# Patient Record
Sex: Female | Born: 1965 | Race: White | Hispanic: No | Smoking: Current every day smoker
Health system: Southern US, Community
[De-identification: ages and names within clinical notes are randomized; demographics above are authoritative.]

## PROBLEM LIST (undated history)

## (undated) VITALS — BP 103/69 | HR 66 | Temp 98.4°F | Resp 16 | Ht 61.0 in | Wt 124.0 lb

## (undated) DIAGNOSIS — M1990 Unspecified osteoarthritis, unspecified site: Secondary | ICD-10-CM

## (undated) DIAGNOSIS — C801 Malignant (primary) neoplasm, unspecified: Secondary | ICD-10-CM

## (undated) DIAGNOSIS — J449 Chronic obstructive pulmonary disease, unspecified: Secondary | ICD-10-CM

## (undated) DIAGNOSIS — F329 Major depressive disorder, single episode, unspecified: Secondary | ICD-10-CM

## (undated) DIAGNOSIS — K219 Gastro-esophageal reflux disease without esophagitis: Secondary | ICD-10-CM

## (undated) DIAGNOSIS — F419 Anxiety disorder, unspecified: Secondary | ICD-10-CM

## (undated) DIAGNOSIS — F32A Depression, unspecified: Secondary | ICD-10-CM

## (undated) DIAGNOSIS — R51 Headache: Secondary | ICD-10-CM

## (undated) DIAGNOSIS — R0602 Shortness of breath: Secondary | ICD-10-CM

## (undated) DIAGNOSIS — F99 Mental disorder, not otherwise specified: Secondary | ICD-10-CM

## (undated) HISTORY — PX: ABDOMINAL HYSTERECTOMY: SHX81

## (undated) HISTORY — PX: OTHER SURGICAL HISTORY: SHX169

---

## 2012-05-23 ENCOUNTER — Encounter (HOSPITAL_COMMUNITY): Payer: Self-pay

## 2012-05-23 ENCOUNTER — Emergency Department (HOSPITAL_COMMUNITY)
Admission: EM | Admit: 2012-05-23 | Discharge: 2012-05-23 | Disposition: A | Discharge status: Home / Self Care | Payer: Self-pay | Attending: Emergency Medicine | Admitting: Emergency Medicine

## 2012-05-23 DIAGNOSIS — F17200 Nicotine dependence, unspecified, uncomplicated: Secondary | ICD-10-CM | POA: Insufficient documentation

## 2012-05-23 DIAGNOSIS — M25529 Pain in unspecified elbow: Secondary | ICD-10-CM | POA: Insufficient documentation

## 2012-05-23 MED ORDER — HYDROCODONE-ACETAMINOPHEN 5-325 MG PO TABS: 1.0000 | ORAL_TABLET | Freq: Three times a day (TID) | ORAL | Status: AC | PRN

## 2012-05-23 MED ORDER — HYDROCODONE-ACETAMINOPHEN 5-325 MG PO TABS
1.0000 | ORAL_TABLET | Freq: Once | ORAL | Status: AC
  Administered 2012-05-23: 1 via ORAL
  Filled 2012-05-23: qty 1

## 2012-05-23 MED ORDER — IBUPROFEN 400 MG PO TABS
800.0000 mg | ORAL_TABLET | Freq: Once | ORAL | Status: AC
  Administered 2012-05-23: 800 mg via ORAL
  Filled 2012-05-23: qty 2

## 2012-05-23 MED ORDER — IBUPROFEN 800 MG PO TABS: 800.0000 mg | ORAL_TABLET | Freq: Three times a day (TID) | ORAL | Status: AC

## 2012-05-23 NOTE — Discharge Instructions (Signed)
It is very important that you rest the elbow as much as possible, and follow-up with your orthopedist.

## 2012-05-23 NOTE — ED Notes (Signed)
Hx of surgery to arm and elbow to right arm. sts pain now in elbow, tendon to elbow is painful and is popping.

## 2012-05-23 NOTE — ED Provider Notes (Signed)
History   This chart was scribed for Gerhard Munch, MD by Shari Heritage. The patient was seen in room TR11C/TR11C. Patient's care was started at 1547.     CSN: [id number]  Arrival date & time 05/23/12  1547   First MD Initiated Contact with Patient 05/23/12 1810      Chief Complaint  Patient presents with  . Joint Swelling    (Consider location/radiation/quality/duration/timing/severity/associated sxs/prior treatment) The history is provided by the patient. No language interpreter was used.   Martha Jordan is a 46 y.o. female who presents to the Emergency Department complaining of dull, constant pain originating in the right elbow and traveling down the forearm to the hand. Patient says she had heard popping in her right elbow. Patient has been taking 800 mg of Ibuprofen regularly, with minimal relief. Patient says she has experienced persistent pain after a work related accident.  Patient has a h/o of arm surgery.  Orthopaedic Surgeon - [facility])  History reviewed. No pertinent past medical history.  History reviewed. No pertinent past surgical history.  History reviewed. No pertinent family history.  History  Substance Use Topics  . Smoking status: Current Everyday Smoker  . Smokeless tobacco: Not on file  . Alcohol Use: No    OB History    Grav Para Term Preterm Abortions TAB SAB Ect Mult Living                  Review of Systems  Constitutional:       Per HPI, otherwise negative  HENT:       Per HPI, otherwise negative  Eyes: Negative.   Respiratory:       Per HPI, otherwise negative  Cardiovascular:       Per HPI, otherwise negative  Gastrointestinal: Negative for vomiting.  Genitourinary: Negative.   Musculoskeletal:       Per HPI, otherwise negative  Skin: Negative.   Neurological: Negative for syncope.    Allergies  Review of patient's allergies indicates not on file.  Home Medications   Current Outpatient Rx  Name Route Sig  Dispense Refill  . ALBUTEROL SULFATE HFA 108 (90 BASE) MCG/ACT IN AERS Inhalation Inhale 2 puffs into the lungs every 6 (six) hours as needed. For shortess of breath    . DULOXETINE HCL 60 MG PO CPEP Oral Take 60 mg by mouth daily.    Marland Kitchen FLUTICASONE PROPIONATE 50 MCG/ACT NA SUSP Nasal Place 2 sprays into the nose daily.    . IBUPROFEN 200 MG PO TABS Oral Take 800 mg by mouth every 6 (six) hours as needed. For pain    . LORAZEPAM 1 MG PO TABS Oral Take 1 mg by mouth 3 (three) times daily.      BP 140/77  Pulse 84  Temp 98.4 F (36.9 C) (Oral)  Resp 16  SpO2 100%  Physical Exam  Nursing note and vitals reviewed. Constitutional: She is oriented to person, place, and time. She appears well-developed and well-nourished. No distress.  HENT:  Head: Normocephalic and atraumatic.  Eyes: Conjunctivae and EOM are normal.  Cardiovascular: Normal rate and regular rhythm.   Pulmonary/Chest: Effort normal and breath sounds normal. No stridor. No respiratory distress.  Abdominal: She exhibits no distension.  Musculoskeletal: She exhibits no edema.       RUE - Normal grip strength. Normal ROM in wrist. Patient can extend arm 160 degrees. Patient can flex arm 100 degrees. Patient can pronate and supinate.  Neurological: She is  alert and oriented to person, place, and time. No cranial nerve deficit.  Skin: Skin is warm and dry.  Psychiatric: She has a normal mood and affect.    ED Course  Procedures (including critical care time) DIAGNOSTIC STUDIES:   COORDINATION OF CARE: 7:12PM- Patient informed of current plan for treatment and evaluation and agrees with plan at this time. Will prescribe anti-inflammatory medicine and provide ice packs. Recommended that patient rest her right arm and follow up with an orthopedist if there is persistent pain.    Labs Reviewed - No data to display No results found.   No diagnosis found.    MDM  I personally performed the services described in this  documentation, which was scribed in my presence. The recorded information has been reviewed and considered.  This female presents with concerns over right elbow pain and an unusual sensation with flexion.  On exam the patient is in no distress.  She does have a limited range of motion, though she notes that this is consistent since her surgery.  There is no appreciable effusion, and all tendons and muscles are functioning appropriately.  Absent any superficial changes concerning for ongoing infection, and with unremarkable vital signs , this episode pain likely represents an overuse injury with some concern for tendinitis.  There may be transient nerve entrapment or bone spurs causing tenderness discontinuity, though these are not obvious on exam.  I discussed all findings, fossa patient.  We agreed that the x-ray, was unlikely to be of diagnostic benefit.  In the patient was discharged in stable condition with analgesics, instructions to follow up with her orthopedist as soon as possible.   Gerhard Munch, MD 05/23/12 Serena Croissant

## 2012-07-26 ENCOUNTER — Encounter (HOSPITAL_COMMUNITY): Payer: Self-pay | Admitting: *Deleted

## 2012-07-26 ENCOUNTER — Inpatient Hospital Stay (HOSPITAL_COMMUNITY)
Admission: RE | Admit: 2012-07-26 | Discharge: 2012-08-01 | DRG: 885 | Disposition: A | Discharge status: Home / Self Care | Payer: Federal, State, Local not specified - Other | Attending: Psychiatry | Admitting: Psychiatry

## 2012-07-26 DIAGNOSIS — F17200 Nicotine dependence, unspecified, uncomplicated: Secondary | ICD-10-CM | POA: Diagnosis present

## 2012-07-26 DIAGNOSIS — F339 Major depressive disorder, recurrent, unspecified: Secondary | ICD-10-CM | POA: Diagnosis present

## 2012-07-26 DIAGNOSIS — M129 Arthropathy, unspecified: Secondary | ICD-10-CM | POA: Diagnosis present

## 2012-07-26 DIAGNOSIS — F332 Major depressive disorder, recurrent severe without psychotic features: Principal | ICD-10-CM | POA: Diagnosis present

## 2012-07-26 DIAGNOSIS — J4489 Other specified chronic obstructive pulmonary disease: Secondary | ICD-10-CM | POA: Diagnosis present

## 2012-07-26 DIAGNOSIS — K219 Gastro-esophageal reflux disease without esophagitis: Secondary | ICD-10-CM | POA: Diagnosis present

## 2012-07-26 DIAGNOSIS — J449 Chronic obstructive pulmonary disease, unspecified: Secondary | ICD-10-CM | POA: Diagnosis present

## 2012-07-26 DIAGNOSIS — Z859 Personal history of malignant neoplasm, unspecified: Secondary | ICD-10-CM

## 2012-07-26 DIAGNOSIS — Z79899 Other long term (current) drug therapy: Secondary | ICD-10-CM

## 2012-07-26 HISTORY — DX: Major depressive disorder, single episode, unspecified: F32.9

## 2012-07-26 HISTORY — DX: Headache: R51

## 2012-07-26 HISTORY — DX: Shortness of breath: R06.02

## 2012-07-26 HISTORY — DX: Anxiety disorder, unspecified: F41.9

## 2012-07-26 HISTORY — DX: Malignant (primary) neoplasm, unspecified: C80.1

## 2012-07-26 HISTORY — DX: Chronic obstructive pulmonary disease, unspecified: J44.9

## 2012-07-26 HISTORY — DX: Depression, unspecified: F32.A

## 2012-07-26 HISTORY — DX: Mental disorder, not otherwise specified: F99

## 2012-07-26 HISTORY — DX: Unspecified osteoarthritis, unspecified site: M19.90

## 2012-07-26 HISTORY — DX: Gastro-esophageal reflux disease without esophagitis: K21.9

## 2012-07-26 LAB — CBC
HCT: 40.4 % (ref 36.0–46.0)
Hemoglobin: 14 g/dL (ref 12.0–15.0)
MCHC: 34.7 g/dL (ref 30.0–36.0)
RBC: 4.46 MIL/uL (ref 3.87–5.11)
WBC: 6.9 10*3/uL (ref 4.0–10.5)

## 2012-07-26 LAB — COMPREHENSIVE METABOLIC PANEL
ALT: 10 U/L (ref 0–35)
Alkaline Phosphatase: 96 U/L (ref 39–117)
BUN: 3 mg/dL — ABNORMAL LOW (ref 6–23)
CO2: 27 mEq/L (ref 19–32)
Chloride: 99 mEq/L (ref 96–112)
GFR calc Af Amer: >90 mL/min (ref >90)
Glucose, Bld: 82 mg/dL (ref 70–99)
Potassium: 3.5 mEq/L (ref 3.5–5.1)
Sodium: 136 mEq/L (ref 135–145)
Total Bilirubin: 0.2 mg/dL — ABNORMAL LOW (ref 0.3–1.2)
Total Protein: 7.4 g/dL (ref 6.0–8.3)

## 2012-07-26 MED ORDER — HYDROXYZINE HCL 50 MG PO TABS
50.0000 mg | ORAL_TABLET | Freq: Every evening | ORAL | Status: DC | PRN
  Administered 2012-07-26 – 2012-07-27 (×3): 50 mg via ORAL
  Filled 2012-07-26: qty 28

## 2012-07-26 MED ORDER — IBUPROFEN 800 MG PO TABS
800.0000 mg | ORAL_TABLET | Freq: Four times a day (QID) | ORAL | Status: DC | PRN
  Administered 2012-07-27 – 2012-07-29 (×4): 800 mg via ORAL
  Filled 2012-07-26 (×4): qty 1

## 2012-07-26 MED ORDER — DULOXETINE HCL 60 MG PO CPEP
60.0000 mg | ORAL_CAPSULE | Freq: Every day | ORAL | Status: DC
  Administered 2012-07-27 – 2012-08-01 (×6): 60 mg via ORAL
  Filled 2012-07-26 (×7): qty 1
  Filled 2012-07-26: qty 14
  Filled 2012-07-26: qty 1

## 2012-07-26 MED ORDER — ACETAMINOPHEN 325 MG PO TABS
650.0000 mg | ORAL_TABLET | Freq: Four times a day (QID) | ORAL | Status: DC | PRN
  Administered 2012-07-26 – 2012-07-30 (×3): 650 mg via ORAL

## 2012-07-26 MED ORDER — FLUTICASONE PROPIONATE 50 MCG/ACT NA SUSP
2.0000 | Freq: Every day | NASAL | Status: DC
  Administered 2012-07-27 – 2012-07-31 (×5): 2 via NASAL
  Filled 2012-07-26: qty 16

## 2012-07-26 MED ORDER — MAGNESIUM HYDROXIDE 400 MG/5ML PO SUSP
30.0000 mL | Freq: Every day | ORAL | Status: DC | PRN
  Administered 2012-07-31: 30 mL via ORAL

## 2012-07-26 MED ORDER — ALUM & MAG HYDROXIDE-SIMETH 200-200-20 MG/5ML PO SUSP: 30.0000 mL | ORAL | Status: DC | PRN

## 2012-07-26 MED ORDER — NICOTINE 21 MG/24HR TD PT24
21.0000 mg | MEDICATED_PATCH | Freq: Every day | TRANSDERMAL | Status: DC
  Administered 2012-07-26 – 2012-08-01 (×6): 21 mg via TRANSDERMAL
  Filled 2012-07-26: qty 1
  Filled 2012-07-26: qty 14
  Filled 2012-07-26 (×7): qty 1

## 2012-07-26 MED ORDER — ALBUTEROL SULFATE HFA 108 (90 BASE) MCG/ACT IN AERS: 2.0000 | INHALATION_SPRAY | Freq: Four times a day (QID) | RESPIRATORY_TRACT | Status: DC | PRN

## 2012-07-26 NOTE — Progress Notes (Signed)
D: Pt. Scored 32  On total score on BECK DEPRESSION INVENTORY.Results in shadow chart.

## 2012-07-26 NOTE — Tx Team (Signed)
Initial Interdisciplinary Treatment Plan  PATIENT STRENGTHS: (choose at least two) Ability for insight Active sense of humor Average or above average intelligence Capable of independent living Communication skills General fund of knowledge Motivation for treatment/growth Supportive family/friends  PATIENT STRESSORS: Health problems Legal issue Marital or family conflict Medication change or noncompliance Occupational concerns Substance abuse   PROBLEM LIST: Problem List/Patient Goals Date to be addressed Date deferred Reason deferred Estimated date of resolution  Depression       anxiety                                                 DISCHARGE CRITERIA:  Ability to meet basic life and health needs Adequate post-discharge living arrangements Improved stabilization in mood, thinking, and/or behavior Reduction of life-threatening or endangering symptoms to within safe limits Verbal commitment to aftercare and medication compliance  PRELIMINARY DISCHARGE PLAN: Participate in family therapy Return to previous living arrangement  PATIENT/FAMIILY INVOLVEMENT: This treatment plan has been presented to and reviewed with the patient, [name], and/or family member, The patient and family have been given the opportunity to ask questions and make suggestions.  [name] 07/26/2012, 7:24 PM

## 2012-07-26 NOTE — Progress Notes (Signed)
Patient attended the evening Karaoke group.  Pt was alert, engaged, and supportive during group.
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
## 2012-07-26 NOTE — Progress Notes (Signed)
D: 46 yo voluntary  Walk-in presenting with self-harm thoughts but no plan. Pt denies SI,HI, & AVH @ this time & contracts for safety on the unit.Pt. Has not worked in 2 years & workmans comp. has not come through. Pt. Has a steel rod in her RUE.Pt denies ETOH abuse but drinks 2-4 beers weekly. Pt has GERD ;smokes 1 PPD;Hx. Cervical cancer;Pt. Also has arthritis;Tatoo on lt. Back & rt. Hip.Pt is allergic to sulfa drugs;VSS.Pt. Lives with her mother.Pt. Has 3 children but only speaks to one.A: pt was searched --no contraband found;oriented to room & unit;meal provided;pt on 15 minute checks.R: pt. Was tearful but has calmed down.

## 2012-07-27 DIAGNOSIS — F339 Major depressive disorder, recurrent, unspecified: Secondary | ICD-10-CM | POA: Diagnosis present

## 2012-07-27 LAB — URINALYSIS, MICROSCOPIC ONLY
Hgb urine dipstick: NEGATIVE
Leukocytes, UA: NEGATIVE
Specific Gravity, Urine: 1.013 (ref 1.005–1.030)
Urobilinogen, UA: 1 mg/dL (ref 0.0–1.0)

## 2012-07-27 LAB — PREGNANCY, URINE: Preg Test, Ur: NEGATIVE

## 2012-07-27 MED ORDER — VENLAFAXINE HCL ER 75 MG PO CP24
75.0000 mg | ORAL_CAPSULE | Freq: Every day | ORAL | Status: AC
  Administered 2012-07-28 – 2012-07-29 (×2): 75 mg via ORAL
  Filled 2012-07-27 (×2): qty 1

## 2012-07-27 MED ORDER — VENLAFAXINE HCL ER 150 MG PO CP24
150.0000 mg | ORAL_CAPSULE | Freq: Every day | ORAL | Status: DC
  Administered 2012-07-30 – 2012-08-01 (×3): 150 mg via ORAL
  Filled 2012-07-27: qty 14
  Filled 2012-07-27 (×4): qty 1

## 2012-07-27 NOTE — Progress Notes (Signed)
D: Pt in bed resting. Pt was given a repeat dose of vistaril by previous nurse at 2251. A: Writer followed up and noted that pt was resting with eyes closed but was easily awaken as Clinical research associate approached pt to check for respirations. As of 1215 pt was resting with eyes closed. q80min checks remains for this pt.  R: Pt remains safe at q15min checks.
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
## 2012-07-27 NOTE — Progress Notes (Signed)
Martha Jordan is slowly coming out of her shell( and room...so to speak ) . SHe is seen out in the milieu...interacting  With the other pts tolerated well. SHe makes very brief eye contact. She looks down, mostly, and speaks very very quietly.              A She attends her groups as planned and completed her AM self inventory and  on it she wrote she denied SI,she rated her feelings of depression and hopelessness " 9 / 9" and stated her DC [plan was " I don't know".                R Safety is in place, POC moves forward with therpaeutic relationhip establsiehd and nurse to discuss with pt and help pt identify DC palnning gaols. PD RN [facility]
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
## 2012-07-27 NOTE — Tx Team (Signed)
Interdisciplinary Treatment Plan Update (Adult)  Date:  07/27/2012  Time Reviewed:  10:53 AM   Progress in Treatment: Attending groups: Yes Participating in groups:  Yes Taking medication as prescribed: Yes Tolerating medication:  Yes Family/Significant other contact made:  Counselor assessing for appropriate contact Patient understands diagnosis:  Yes Discussing patient identified problems/goals with staff:  Yes Medical problems stabilized or resolved:  Yes Denies suicidal/homicidal ideation: Yes Issues/concerns per patient self-inventory:  None identified Other: N/A  New problem(s) identified: None Identified  Reason for Continuation of Hospitalization: Anxiety Depression Medication stabilization Suicidal ideation  Interventions implemented related to continuation of hospitalization: mood stabilization, medication monitoring and adjustment, group therapy and psycho education, safety checks q 15 mins  Additional comments: N/A  Estimated length of stay: 3-5 days  Discharge Plan: SW will assess for appropriate referrals.    New goal(s): N/A  Review of initial/current patient goals per problem list:    1.  Goal(s): Reduce depressive symptoms  Met:  No  Target date: by discharge  As evidenced by: Reducing depression from a 10 to a 3 as reported by pt.   2.  Goal (s): Reduce/Eliminate suicidal ideation  Met:  No  Target date: by discharge  As evidenced by: pt reporting no SI.    3.  Goal(s): Reduce anxiety symptoms  Met:  No  Target date: by discharge  As evidenced by: Reduce anxiety from a 10 to a 3 as reported by pt.    Attendees: Patient:  Martha Jordan 07/27/2012 10:53 AM   Family:     Physician:  Orson Aloe, MD  07/27/2012  10:53 AM   Nursing:   Lamount Cranker ,RN 07/27/2012 10:53 AM   Case Manager:  Reyes Ivan, LCSWA 07/27/2012  10:53 AM   Counselor:   07/27/2012  10:53 AM   Other:  Juline Patch, LCSW 07/27/2012  10:53 AM   Other:  Trula Slade,  MSW intern 07/27/2012 10:53 AM   Other:  Foye Clock, MSW intern 07/27/2012 10:54 AM   Other:      Scribe for Treatment Team:   Carmina Miller, 07/27/2012 , 10:53 AM
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
## 2012-07-27 NOTE — [facility] Suicide Risk Assessment (Signed)
Suicide Risk Assessment  Admission Assessment     Demographic factors:  Assessment Details Time of Assessment: Admission Information Obtained From: Patient Current Mental Status:  Current Mental Status: Suicidal ideation indicated by patient;Self-harm thoughts Loss Factors:  Loss Factors: Loss of significant relationship;Legal issues;Financial problems / change in socioeconomic status Historical Factors:  Historical Factors: Family history of suicide Risk Reduction Factors:  Risk Reduction Factors: Sense of responsibility to family;Religious beliefs about death;Living with another person, especially a relative  CLINICAL FACTORS:   Severe Anxiety and/or Agitation Depression:   Anhedonia Previous Psychiatric Diagnoses and Treatments  COGNITIVE FEATURES THAT CONTRIBUTE TO RISK:  Thought constriction (tunnel vision)    SUICIDE RISK:   Moderate:  Frequent suicidal ideation with limited intensity, and duration, some specificity in terms of plans, no associated intent, good self-control, limited dysphoria/symptomatology, some risk factors present, and identifiable protective factors, including available and accessible social support.  Reason for hospitalization: .Feeling hopeless and depressed Diagnosis:   Axis I: Major Depression, Recurrent severe and Compulsive behavior disorder Axis II: Deferred Axis III:  Past Medical History  Diagnosis Date  . Anxiety   . Mental disorder   . Depression   . COPD (chronic obstructive pulmonary disease)   . Shortness of breath   . GERD (gastroesophageal reflux disease)   . Headache   . Cancer   . Arthritis    Axis IV: other psychosocial or environmental problems Axis V: 31-40 impairment in reality testing  ADL's:  Intact  Sleep: Fair  Appetite:  Fair  Suicidal Ideation:  Pt quite depressed and feeling hopeless Homicidal Ideation:  Pt denies any thoughts, plans, intent of homicide  AEB (as evidenced by): per pt report  Mental Status  Examination/Evaluation: Objective:  Appearance: Casual  Eye Contact::  Good  Speech:  Clear and Coherent  Volume:  Normal  Mood:  Anxious, Depressed, Hopeless, Irritable and Worthless  Affect:  Congruent  Thought Process:  Coherent  Orientation:  Full  Thought Content:  WDL  Suicidal Thoughts:  No  Homicidal Thoughts:  No  Memory:  Immediate;   Fair Recent;   Fair Remote;   Fair  Judgement:  Impaired  Insight:  Lacking  Psychomotor Activity:  Normal  Concentration:  Fair  Recall:  Fair  Akathisia:  No  Handed:  Right  AIMS (if indicated):     Assets:  Communication Skills Desire for Improvement  Sleep:  Number of Hours: 3.75    Vital Signs:Blood pressure 120/74, pulse 76, temperature 98.4 F (36.9 C), temperature source Oral, resp. rate 16, height 5\' 1"  (1.549 m), weight 56.246 kg (124 lb). Current Medications: Current Facility-Administered Medications  Medication Dose Route Frequency Provider Last Rate Last Dose  . acetaminophen (TYLENOL) tablet 650 mg  650 mg Oral Q6H PRN Mickie D. Adams, PA   650 mg at 07/26/12 2001  . albuterol (PROVENTIL HFA;VENTOLIN HFA) 108 (90 BASE) MCG/ACT inhaler 2 puff  2 puff Inhalation Q6H PRN Mickie D. Adams, PA      . alum & mag hydroxide-simeth (MAALOX/MYLANTA) 200-200-20 MG/5ML suspension 30 mL  30 mL Oral Q4H PRN Mickie D. Pernell Dupre, PA      . DULoxetine (CYMBALTA) DR capsule 60 mg  60 mg Oral Daily Mickie D. Adams, PA   60 mg at 07/27/12 1002  . fluticasone (FLONASE) 50 MCG/ACT nasal spray 2 spray  2 spray Each Nare Daily Mickie D. Adams, PA   2 spray at 07/27/12 1427  . hydrOXYzine (ATARAX/VISTARIL) tablet 50 mg  50  mg Oral QHS PRN,MR X 1 Mickie D. Adams, PA   50 mg at 07/26/12 2251  . ibuprofen (ADVIL,MOTRIN) tablet 800 mg  800 mg Oral Q6H PRN Mickie D. Adams, PA   800 mg at 07/27/12 1442  . magnesium hydroxide (MILK OF MAGNESIA) suspension 30 mL  30 mL Oral Daily PRN Mickie D. Adams, PA      . nicotine (NICODERM CQ - dosed in mg/24 hours)  patch 21 mg  21 mg Transdermal Q0600 Mickie D. Adams, PA   21 mg at 07/26/12 2004  . venlafaxine XR (EFFEXOR-XR) 24 hr capsule 75 mg  75 mg Oral Q breakfast Mike Craze, MD       Followed by  . venlafaxine XR (EFFEXOR-XR) 24 hr capsule 150 mg  150 mg Oral Q breakfast Mike Craze, MD        Lab Results:  Results for orders placed during the hospital encounter of 07/26/12 (from the past 48 hour(s))  CBC     Status: Normal   Collection Time   07/26/12  7:31 PM      Component Value Range Comment   WBC 6.9  4.0 - 10.5 K/uL    RBC 4.46  3.87 - 5.11 MIL/uL    Hemoglobin 14.0  12.0 - 15.0 g/dL    HCT 81.1  91.4 - 78.2 %    MCV 90.6  78.0 - 100.0 fL    MCH 31.4  26.0 - 34.0 pg    MCHC 34.7  30.0 - 36.0 g/dL    RDW 95.6  21.3 - 08.6 %    Platelets 330  150 - 400 K/uL   COMPREHENSIVE METABOLIC PANEL     Status: Abnormal   Collection Time   07/26/12  7:31 PM      Component Value Range Comment   Sodium 136  135 - 145 mEq/L    Potassium 3.5  3.5 - 5.1 mEq/L    Chloride 99  96 - 112 mEq/L    CO2 27  19 - 32 mEq/L    Glucose, Bld 82  70 - 99 mg/dL    BUN 3 (*) 6 - 23 mg/dL    Creatinine, Ser 5.78  0.50 - 1.10 mg/dL    Calcium 9.5  8.4 - 46.9 mg/dL    Total Protein 7.4  6.0 - 8.3 g/dL    Albumin 3.7  3.5 - 5.2 g/dL    AST 16  0 - 37 U/L    ALT 10  0 - 35 U/L    Alkaline Phosphatase 96  39 - 117 U/L    Total Bilirubin 0.2 (*) 0.3 - 1.2 mg/dL    GFR calc non Af Amer >90  >90 mL/min    GFR calc Af Amer >90  >90 mL/min   TSH     Status: Normal   Collection Time   07/26/12  7:31 PM      Component Value Range Comment   TSH 0.633  0.350 - 4.500 uIU/mL   URINALYSIS, WITH MICROSCOPIC     Status: Normal   Collection Time   07/26/12 10:06 PM      Component Value Range Comment   Color, Urine YELLOW  YELLOW    APPearance CLEAR  CLEAR    Specific Gravity, Urine 1.013  1.005 - 1.030    pH 6.0  5.0 - 8.0    Glucose, UA NEGATIVE  NEGATIVE mg/dL    Hgb urine dipstick NEGATIVE  NEGATIVE  Bilirubin Urine NEGATIVE  NEGATIVE    Ketones, ur NEGATIVE  NEGATIVE mg/dL    Protein, ur NEGATIVE  NEGATIVE mg/dL    Urobilinogen, UA 1.0  0.0 - 1.0 mg/dL    Nitrite NEGATIVE  NEGATIVE    Leukocytes, UA NEGATIVE  NEGATIVE    WBC, UA 0-2  <3 WBC/hpf    RBC / HPF 0-2  <3 RBC/hpf    Bacteria, UA RARE  RARE    Squamous Epithelial / LPF RARE  RARE   PREGNANCY, URINE     Status: Normal   Collection Time   07/26/12 10:06 PM      Component Value Range Comment   Preg Test, Ur NEGATIVE  NEGATIVE     Physical Findings: AIMS: Facial and Oral Movements Muscles of Facial Expression: None, normal Lips and Perioral Area: None, normal Jaw: None, normal Tongue: None, normal,Extremity Movements Upper (arms, wrists, hands, fingers): None, normal Lower (legs, knees, ankles, toes): None, normal, Trunk Movements Neck, shoulders, hips: None, normal, Overall Severity Severity of abnormal movements (highest score from questions above): None, normal Incapacitation due to abnormal movements: None, normal Patient's awareness of abnormal movements (rate only patient's report): No Awareness, Dental Status Current problems with teeth and/or dentures?: No Does patient usually wear dentures?: No  CIWA:  CIWA-Ar Total: 0  COWS:     Risk: Risk of harm to self is elevated by her depression and her addiction to fixing other people  Risk of harm to others is minimal in that she has not been involved in fights or had any legal charges filed on her.  Treatment Plan Summary: Daily contact with patient to assess and evaluate symptoms and progress in treatment Medication management Mood/anxiety less than 3/10 where the scale is 1 is the best and 10 is the worst No suicidal or homicidal thoughts for at least 48 hours.  Plan: Admit, restart Effexor, encourage pt to attend 12 Step meetings to help her find a different way of living life.  Discussed the risks, benefits, and probable clinical course with and without  treatment.  Pt is agreeable to the current course of treatment. We will continue on q. 15 checks the unit protocol. At this time there is no clinical indication for one-to-one observation as patient contract for safety and presents little risk to harm themself and others.  We will increase collateral information. I encourage patient to participate in group milieu therapy. Pt will be seen in treatment team soon for further treatment and appropriate discharge planning. Please see history and physical note for more detailed information ELOS: 3 to 5 days.   Tnia Anglada 07/27/2012, 7:32 PM

## 2012-07-27 NOTE — Progress Notes (Signed)
Psychoeducational Group Note  Date:  07/27/2012 Time:  1100  Group Topic/Focus:  Recovery Goals:   The focus of this group is to identify appropriate goals for recovery and establish a plan to achieve them.  Participation Level:  Active  Participation Quality:  Appropriate and Attentive  Affect:  Appropriate  Cognitive:  Appropriate  Insight:  Good  Engagement in Group:  Good  Additional Comments:  Pt participated in group. Pt explained defined in own words of relapse and was given term of relapse.Pt also was given information on recognizing when in crisis of relapsing. Pt stated ways, times, behaviors, and attitudes when in stages of relapsing. Pt also was given a handout on defining relapse, and coping skills to prevent relapsing. Pt was appropriate and shared during group.   Karleen Hampshire Brittini 07/27/2012, 3:16 PM

## 2012-07-27 NOTE — Progress Notes (Signed)
[facility] Group Notes:  (Counselor/Nursing/MHT/Case Management/Adjunct)  07/27/2012 10:23 PM  Type of Therapy:  Psychoeducational Skills  Participation Level:  Minimal  Participation Quality:  Attentive  Affect:  Depressed  Cognitive:  Appropriate  Insight:  Good  Engagement in Group:  Limited  Engagement in Therapy:  Limited  Modes of Intervention:  Education  Summary of Progress/Problems:Pt. States that she had a better day as compared to yesterday. She attributes her good day to socializing more with her peers and because she feels that her medications are working better for her. Her goal for tomorrow is to continue to work on talking to her peers.     Martha Jordan S 07/27/2012, 10:23 PM

## 2012-07-27 NOTE — Progress Notes (Signed)
[facility] MD Progress Note  07/27/2012 7:24 PM  Diagnosis:   Axis I: Major Depression, Recurrent severe and Compulsive behavior disorder Axis II: Deferred Axis III:  Past Medical History  Diagnosis Date  . Anxiety   . Mental disorder   . Depression   . COPD (chronic obstructive pulmonary disease)   . Shortness of breath   . GERD (gastroesophageal reflux disease)   . Headache   . Cancer   . Arthritis    Axis IV: other psychosocial or environmental problems Axis V: 31-40 impairment in reality testing  ADL's:  Intact  Sleep: Fair  Appetite:  Fair  Suicidal Ideation:  Pt quite depressed and feeling hopeless Homicidal Ideation:  Pt denies any thoughts, plans, intent of homicide  AEB (as evidenced by): per pt report  Mental Status Examination/Evaluation: Objective:  Appearance: Casual  Eye Contact::  Good  Speech:  Clear and Coherent  Volume:  Normal  Mood:  Anxious, Depressed, Hopeless, Irritable and Worthless  Affect:  Congruent  Thought Process:  Coherent  Orientation:  Full  Thought Content:  WDL  Suicidal Thoughts:  No  Homicidal Thoughts:  No  Memory:  Immediate;   Fair Recent;   Fair Remote;   Fair  Judgement:  Impaired  Insight:  Lacking  Psychomotor Activity:  Normal  Concentration:  Fair  Recall:  Fair  Akathisia:  No  Handed:  Right  AIMS (if indicated):     Assets:  Communication Skills Desire for Improvement  Sleep:  Number of Hours: 3.75    Vital Signs:Blood pressure 120/74, pulse 76, temperature 98.4 F (36.9 C), temperature source Oral, resp. rate 16, height 5\' 1"  (1.549 m), weight 56.246 kg (124 lb). Current Medications: Current Facility-Administered Medications  Medication Dose Route Frequency Provider Last Rate Last Dose  . acetaminophen (TYLENOL) tablet 650 mg  650 mg Oral Q6H PRN Mickie D. Adams, PA   650 mg at 07/26/12 2001  . albuterol (PROVENTIL HFA;VENTOLIN HFA) 108 (90 BASE) MCG/ACT inhaler 2 puff  2 puff Inhalation Q6H PRN Mickie D.  Adams, PA      . alum & mag hydroxide-simeth (MAALOX/MYLANTA) 200-200-20 MG/5ML suspension 30 mL  30 mL Oral Q4H PRN Mickie D. Pernell Dupre, PA      . DULoxetine (CYMBALTA) DR capsule 60 mg  60 mg Oral Daily Mickie D. Adams, PA   60 mg at 07/27/12 1002  . fluticasone (FLONASE) 50 MCG/ACT nasal spray 2 spray  2 spray Each Nare Daily Mickie D. Adams, PA   2 spray at 07/27/12 1427  . hydrOXYzine (ATARAX/VISTARIL) tablet 50 mg  50 mg Oral QHS PRN,MR X 1 Mickie D. Adams, PA   50 mg at 07/26/12 2251  . ibuprofen (ADVIL,MOTRIN) tablet 800 mg  800 mg Oral Q6H PRN Mickie D. Adams, PA   800 mg at 07/27/12 1442  . magnesium hydroxide (MILK OF MAGNESIA) suspension 30 mL  30 mL Oral Daily PRN Mickie D. Adams, PA      . nicotine (NICODERM CQ - dosed in mg/24 hours) patch 21 mg  21 mg Transdermal Q0600 Mickie D. Adams, PA   21 mg at 07/26/12 2004  . venlafaxine XR (EFFEXOR-XR) 24 hr capsule 75 mg  75 mg Oral Q breakfast Mike Craze, MD       Followed by  . venlafaxine XR (EFFEXOR-XR) 24 hr capsule 150 mg  150 mg Oral Q breakfast Mike Craze, MD        Lab Results:  Results for orders  placed during the hospital encounter of 07/26/12 (from the past 48 hour(s))  CBC     Status: Normal   Collection Time   07/26/12  7:31 PM      Component Value Range Comment   WBC 6.9  4.0 - 10.5 K/uL    RBC 4.46  3.87 - 5.11 MIL/uL    Hemoglobin 14.0  12.0 - 15.0 g/dL    HCT 96.0  45.4 - 09.8 %    MCV 90.6  78.0 - 100.0 fL    MCH 31.4  26.0 - 34.0 pg    MCHC 34.7  30.0 - 36.0 g/dL    RDW 11.9  14.7 - 82.9 %    Platelets 330  150 - 400 K/uL   COMPREHENSIVE METABOLIC PANEL     Status: Abnormal   Collection Time   07/26/12  7:31 PM      Component Value Range Comment   Sodium 136  135 - 145 mEq/L    Potassium 3.5  3.5 - 5.1 mEq/L    Chloride 99  96 - 112 mEq/L    CO2 27  19 - 32 mEq/L    Glucose, Bld 82  70 - 99 mg/dL    BUN 3 (*) 6 - 23 mg/dL    Creatinine, Ser 5.62  0.50 - 1.10 mg/dL    Calcium 9.5  8.4 - 13.0 mg/dL      Total Protein 7.4  6.0 - 8.3 g/dL    Albumin 3.7  3.5 - 5.2 g/dL    AST 16  0 - 37 U/L    ALT 10  0 - 35 U/L    Alkaline Phosphatase 96  39 - 117 U/L    Total Bilirubin 0.2 (*) 0.3 - 1.2 mg/dL    GFR calc non Af Amer >90  >90 mL/min    GFR calc Af Amer >90  >90 mL/min   TSH     Status: Normal   Collection Time   07/26/12  7:31 PM      Component Value Range Comment   TSH 0.633  0.350 - 4.500 uIU/mL   URINALYSIS, WITH MICROSCOPIC     Status: Normal   Collection Time   07/26/12 10:06 PM      Component Value Range Comment   Color, Urine YELLOW  YELLOW    APPearance CLEAR  CLEAR    Specific Gravity, Urine 1.013  1.005 - 1.030    pH 6.0  5.0 - 8.0    Glucose, UA NEGATIVE  NEGATIVE mg/dL    Hgb urine dipstick NEGATIVE  NEGATIVE    Bilirubin Urine NEGATIVE  NEGATIVE    Ketones, ur NEGATIVE  NEGATIVE mg/dL    Protein, ur NEGATIVE  NEGATIVE mg/dL    Urobilinogen, UA 1.0  0.0 - 1.0 mg/dL    Nitrite NEGATIVE  NEGATIVE    Leukocytes, UA NEGATIVE  NEGATIVE    WBC, UA 0-2  <3 WBC/hpf    RBC / HPF 0-2  <3 RBC/hpf    Bacteria, UA RARE  RARE    Squamous Epithelial / LPF RARE  RARE   PREGNANCY, URINE     Status: Normal   Collection Time   07/26/12 10:06 PM      Component Value Range Comment   Preg Test, Ur NEGATIVE  NEGATIVE     Physical Findings: AIMS: Facial and Oral Movements Muscles of Facial Expression: None, normal Lips and Perioral Area: None, normal Jaw: None, normal Tongue: None, normal,Extremity Movements  Upper (arms, wrists, hands, fingers): None, normal Lower (legs, knees, ankles, toes): None, normal, Trunk Movements Neck, shoulders, hips: None, normal, Overall Severity Severity of abnormal movements (highest score from questions above): None, normal Incapacitation due to abnormal movements: None, normal Patient's awareness of abnormal movements (rate only patient's report): No Awareness, Dental Status Current problems with teeth and/or dentures?: No Does patient usually  wear dentures?: No  CIWA:  CIWA-Ar Total: 0  COWS:     Treatment Plan Summary: Daily contact with patient to assess and evaluate symptoms and progress in treatment Medication management Mood/anxiety less than 3/10 where the scale is 1 is the best and 10 is the worst No suicidal or homicidal thoughts for at least 48 hours.  Plan: Admit, restart Effexor, encourage pt to attend 12 Step meetings to help her find a different way of living life.  Discussed the risks, benefits, and probable clinical course with and without treatment.  Pt is agreeable to the current course of treatment.  Kali Deadwyler 07/27/2012, 7:24 PM

## 2012-07-27 NOTE — H&P (Signed)
Medical/psychiatric screening examination/treatment/procedure(s) were performed by non-physician practitioner and as supervising physician I was immediately available for consultation/collaboration.  I have seen and examined this patient and agree the major elements of this evaluation.  

## 2012-07-28 LAB — URINE DRUGS OF ABUSE SCREEN W ALC, ROUTINE (REF LAB)
Benzodiazepines.: NEGATIVE
Cocaine Metabolites: NEGATIVE
Methadone: NEGATIVE
Phencyclidine (PCP): NEGATIVE
Propoxyphene: NEGATIVE

## 2012-07-28 LAB — DRUGS OF ABUSE SCREEN W/O ALC, ROUTINE URINE
Amphetamine Screen, Ur: NEGATIVE
Benzodiazepines.: NEGATIVE
Marijuana Metabolite: NEGATIVE
Methadone: NEGATIVE
Propoxyphene: NEGATIVE

## 2012-07-28 MED ORDER — TRAZODONE HCL 50 MG PO TABS
50.0000 mg | ORAL_TABLET | Freq: Every day | ORAL | Status: DC
  Administered 2012-07-28 – 2012-07-31 (×4): 50 mg via ORAL
  Filled 2012-07-28 (×3): qty 1
  Filled 2012-07-28: qty 14
  Filled 2012-07-28 (×3): qty 1

## 2012-07-28 NOTE — Progress Notes (Signed)
Psychoeducational Group Note  Date:  07/28/2012 Time: 1015  Group Topic/Focus:  Identifying Needs:   The focus of this group is to help patients identify their personal needs that have been historically problematic and identify healthy behaviors to address their needs.  Participation Level:  Active  Participation Quality:  Appropriate  Affect:  Appropriate  Cognitive:  Alert  Insight:  Limited  Engagement in Group:  Good  Additional Comments:    07/28/2012,11:42 AM Rich Brave

## 2012-07-28 NOTE — Progress Notes (Signed)
[facility] MD Progress Note  07/28/2012 2:53 PM  S: "I'm feeling much better. But my sleeping is still off. I'm not sleeping as I should at night. I have used trazodone in the past. It did help at that time".  Diagnosis:   Axis I: Major depressive disorder, recurrent episode Axis II: Deferred Axis III:  Past Medical History  Diagnosis Date  . Anxiety   . Mental disorder   . Depression   . COPD (chronic obstructive pulmonary disease)   . Shortness of breath   . GERD (gastroesophageal reflux disease)   . Headache   . Cancer   . Arthritis    Axis IV: other psychosocial or environmental problems Axis V: 51-60 moderate symptoms  ADL's:  Intact  Sleep: Fair  Appetite:  Good  Suicidal Ideation: "No" Plan:  No Intent:  no Means:  no Homicidal Ideation:  Plan:  No Intent:  no Means:  no  AEB (as evidenced by): Per patient's reports.  Mental Status Examination/Evaluation: Objective:  Appearance: Casual  Eye Contact::  Good  Speech:  Clear and Coherent  Volume:  Normal  Mood:  Irritable  Affect:  Appropriate  Thought Process:  Coherent  Orientation:  Full  Thought Content:  Rumination  Suicidal Thoughts:  No  Homicidal Thoughts:  No  Memory:  Immediate;   Good Recent;   Good Remote;   Good  Judgement:  Fair  Insight:  Fair  Psychomotor Activity:  Normal  Concentration:  Fair  Recall:  Good  Akathisia:  No  Handed:  Right  AIMS (if indicated):     Assets:  Desire for Improvement  Sleep:  Number of Hours: 6    Vital Signs:Blood pressure 118/70, pulse 59, temperature 97.2 F (36.2 C), temperature source Oral, resp. rate 18, height 5\' 1"  (1.549 m), weight 56.246 kg (124 lb). Current Medications: Current Facility-Administered Medications  Medication Dose Route Frequency Provider Last Rate Last Dose  . acetaminophen (TYLENOL) tablet 650 mg  650 mg Oral Q6H PRN Mickie D. Adams, PA   650 mg at 07/26/12 2001  . albuterol (PROVENTIL HFA;VENTOLIN HFA) 108 (90 BASE)  MCG/ACT inhaler 2 puff  2 puff Inhalation Q6H PRN Mickie D. Adams, PA      . alum & mag hydroxide-simeth (MAALOX/MYLANTA) 200-200-20 MG/5ML suspension 30 mL  30 mL Oral Q4H PRN Mickie D. Pernell Dupre, PA      . DULoxetine (CYMBALTA) DR capsule 60 mg  60 mg Oral Daily Mickie D. Adams, PA   60 mg at 07/28/12 0850  . fluticasone (FLONASE) 50 MCG/ACT nasal spray 2 spray  2 spray Each Nare Daily Mickie D. Adams, PA   2 spray at 07/28/12 0850  . hydrOXYzine (ATARAX/VISTARIL) tablet 50 mg  50 mg Oral QHS PRN,MR X 1 Mickie D. Adams, PA   50 mg at 07/27/12 2107  . ibuprofen (ADVIL,MOTRIN) tablet 800 mg  800 mg Oral Q6H PRN Mickie D. Adams, PA   800 mg at 07/27/12 2154  . magnesium hydroxide (MILK OF MAGNESIA) suspension 30 mL  30 mL Oral Daily PRN Mickie D. Adams, PA      . nicotine (NICODERM CQ - dosed in mg/24 hours) patch 21 mg  21 mg Transdermal Q0600 Mickie D. Adams, PA   21 mg at 07/28/12 0610  . venlafaxine XR (EFFEXOR-XR) 24 hr capsule 75 mg  75 mg Oral Q breakfast Mike Craze, MD   75 mg at 07/28/12 0850   Followed by  . venlafaxine XR [facility])  24 hr capsule 150 mg  150 mg Oral Q breakfast Mike Craze, MD        Lab Results:  Results for orders placed during the hospital encounter of 07/26/12 (from the past 48 hour(s))  CBC     Status: Normal   Collection Time   07/26/12  7:31 PM      Component Value Range Comment   WBC 6.9  4.0 - 10.5 K/uL    RBC 4.46  3.87 - 5.11 MIL/uL    Hemoglobin 14.0  12.0 - 15.0 g/dL    HCT 16.1  09.6 - 04.5 %    MCV 90.6  78.0 - 100.0 fL    MCH 31.4  26.0 - 34.0 pg    MCHC 34.7  30.0 - 36.0 g/dL    RDW 40.9  81.1 - 91.4 %    Platelets 330  150 - 400 K/uL   COMPREHENSIVE METABOLIC PANEL     Status: Abnormal   Collection Time   07/26/12  7:31 PM      Component Value Range Comment   Sodium 136  135 - 145 mEq/L    Potassium 3.5  3.5 - 5.1 mEq/L    Chloride 99  96 - 112 mEq/L    CO2 27  19 - 32 mEq/L    Glucose, Bld 82  70 - 99 mg/dL    BUN 3 (*) 6 - 23  mg/dL    Creatinine, Ser 7.82  0.50 - 1.10 mg/dL    Calcium 9.5  8.4 - 95.6 mg/dL    Total Protein 7.4  6.0 - 8.3 g/dL    Albumin 3.7  3.5 - 5.2 g/dL    AST 16  0 - 37 U/L    ALT 10  0 - 35 U/L    Alkaline Phosphatase 96  39 - 117 U/L    Total Bilirubin 0.2 (*) 0.3 - 1.2 mg/dL    GFR calc non Af Amer >90  >90 mL/min    GFR calc Af Amer >90  >90 mL/min   TSH     Status: Normal   Collection Time   07/26/12  7:31 PM      Component Value Range Comment   TSH 0.633  0.350 - 4.500 uIU/mL   DRUGS OF ABUSE SCREEN W/O ALC, ROUTINE URINE     Status: Normal   Collection Time   07/26/12 10:06 PM      Component Value Range Comment   Marijuana Metabolite NEGATIVE  Negative    Amphetamine Screen, Ur NEGATIVE  Negative    Barbiturate Quant, Ur NEGATIVE  Negative    Methadone NEGATIVE  Negative    Benzodiazepines. NEGATIVE  Negative    Phencyclidine (PCP) NEGATIVE  Negative    Cocaine Metabolites NEGATIVE  Negative    Opiate Screen, Urine NEGATIVE  Negative    Propoxyphene NEGATIVE  Negative    Creatinine,U 59.2     URINALYSIS, WITH MICROSCOPIC     Status: Normal   Collection Time   07/26/12 10:06 PM      Component Value Range Comment   Color, Urine YELLOW  YELLOW    APPearance CLEAR  CLEAR    Specific Gravity, Urine 1.013  1.005 - 1.030    pH 6.0  5.0 - 8.0    Glucose, UA NEGATIVE  NEGATIVE mg/dL    Hgb urine dipstick NEGATIVE  NEGATIVE    Bilirubin Urine NEGATIVE  NEGATIVE    Ketones, ur NEGATIVE  NEGATIVE  mg/dL    Protein, ur NEGATIVE  NEGATIVE mg/dL    Urobilinogen, UA 1.0  0.0 - 1.0 mg/dL    Nitrite NEGATIVE  NEGATIVE    Leukocytes, UA NEGATIVE  NEGATIVE    WBC, UA 0-2  <3 WBC/hpf    RBC / HPF 0-2  <3 RBC/hpf    Bacteria, UA RARE  RARE    Squamous Epithelial / LPF RARE  RARE   PREGNANCY, URINE     Status: Normal   Collection Time   07/26/12 10:06 PM      Component Value Range Comment   Preg Test, Ur NEGATIVE  NEGATIVE   DRUGS OF ABUSE SCREEN W ALC, ROUTINE URINE     Status:  Normal   Collection Time   07/27/12  2:39 PM      Component Value Range Comment   Amphetamine Screen, Ur NEGATIVE  Negative    Marijuana Metabolite NEGATIVE  Negative    Barbiturate Quant, Ur NEGATIVE  Negative    Methadone NEGATIVE  Negative    Propoxyphene NEGATIVE  Negative    Benzodiazepines. NEGATIVE  Negative    Phencyclidine (PCP) NEGATIVE  Negative    Cocaine Metabolites NEGATIVE  Negative    Opiate Screen, Urine NEGATIVE  Negative    Ethyl Alcohol <10  <10 mg/dL    Creatinine,U 16.1       Physical Findings: AIMS: Facial and Oral Movements Muscles of Facial Expression: None, normal Lips and Perioral Area: None, normal Jaw: None, normal Tongue: None, normal,Extremity Movements Upper (arms, wrists, hands, fingers): None, normal Lower (legs, knees, ankles, toes): None, normal, Trunk Movements Neck, shoulders, hips: None, normal, Overall Severity Severity of abnormal movements (highest score from questions above): None, normal Incapacitation due to abnormal movements: None, normal Patient's awareness of abnormal movements (rate only patient's report): No Awareness, Dental Status Current problems with teeth and/or dentures?: No Does patient usually wear dentures?: No  CIWA:  CIWA-Ar Total: 0  COWS:     Treatment Plan Summary: Daily contact with patient to assess and evaluate symptoms and progress in treatment Medication management  Plan: Start Trazodone 50 mg Q hs for sleep. Continue current treatment plan  Sanjuana Kava 07/28/2012, 2:53 PM

## 2012-07-28 NOTE — Progress Notes (Signed)
Patient ID: Martha Jordan, female   DOB: 28-Nov-1966, 46 y.o.   MRN: [id number] Pt resting in bed with eyes closed.  RR equal and unlabored.  Pt's safety maintained on unit.

## 2012-07-28 NOTE — Progress Notes (Signed)
Martha Jordan remains sad, depressed and withdrawn. She did complete her self inventory and on it she wrote she denied SI within the past 24 hrs, she rated her depression and hopelessness "7/7" and stated her DC plan is to"try to stay away from negative people, try to stop worrying about people who hurt me".              A SHe is compliant with her meds and her POC , staying engaged in her recovery.She attends her groups as planned.               R Safety is in place and POC includes fosterng therapeutic relationship P)D RN [facility]
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
## 2012-07-28 NOTE — Progress Notes (Signed)
[facility] Group Notes:  (Counselor/Nursing/MHT/Case Management/Adjunct)  07/28/2012 10:01 AM  Type of Therapy:  After care Planning Group  Pt. participated in  After care planning group and was given [facility] Suicide Prevention information along with crisis numbers and agreed to use them if needed. Pt. Sated that she saw Dt. Walker on Friday and that she came to [facility] due to her depression and SI thoughts. Pt. Sates the doctor is adjusting her medication . Pt. denied SI/HI today. Martha Jordan 07/28/2012, 10:01 AM

## 2012-07-28 NOTE — Progress Notes (Signed)
[facility] Group Notes:  (Counselor/Nursing/MHT/Case Management/Adjunct)  07/28/2012 6:03 PM  Type of Therapy:  Group Therapy  Participation Level:  None  Participation Quality:  did not attend  Affect:  Appropriate  Cognitive:  Appropriate  Insight:  None  Engagement in Group:  None  Engagement in Therapy:  None  Modes of Intervention:  Education and Support  Summary of Progress/Problems: Pt. Did not attend  Sondra Come 07/28/2012, 6:03 PM
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
## 2012-07-29 DIAGNOSIS — F339 Major depressive disorder, recurrent, unspecified: Secondary | ICD-10-CM

## 2012-07-29 NOTE — Progress Notes (Signed)
[facility] Group Notes:  (Counselor/Nursing/MHT/Case Management/Adjunct)  07/29/2012 5:41 PM  Type of Therapy:  Group Therapy  Participation Level:  Active  Participation Quality:  Appropriate and Attentive  Affect:  Appropriate  Cognitive:  Appropriate  Insight:  Limited  Engagement in Group:  Good  Engagement in Therapy:  Good  Modes of Intervention:  Clarification, Education, Socialization and Support  Summary of Progress/Problems: Pt. participated in group on health support systems and how  they(patients) can support themselves when their supports are not around. The patients also shared what the word support " meant to them".  Each pt. Also shared who they had as a support in their life and participated in a support activity and what it me and to actually feel support from someone. Pt. Spoke about support meaning to her someone you can count on in good and bad times. The pt. Spoke about her mother being a support to her  Neila Gear 07/29/2012, 5:41 PM

## 2012-07-29 NOTE — Progress Notes (Signed)
[facility] MD Progress Note  07/29/2012 2:05 PM  S: "I am doing and feeling much better.  I slept well last night. My mood is great.  No thoughts of suicide or homicide".  Diagnosis:   Axis I: Major depressive disorder, recurrent episode Axis II: Deferred Axis III:  Past Medical History  Diagnosis Date  . Anxiety   . Mental disorder   . Depression   . COPD (chronic obstructive pulmonary disease)   . Shortness of breath   . GERD (gastroesophageal reflux disease)   . Headache   . Cancer   . Arthritis    Axis IV: other psychosocial or environmental problems Axis V: 61-70 mild symptoms  ADL's:  Intact  Sleep: Good  Appetite:  Good  Suicidal Ideation: "No" Plan:  No Intent:  No Means:  no Homicidal Ideation: "No" Plan:  No Intent:  No Means:  no  AEB (as evidenced by): Per patient's reports.  Mental Status Examination/Evaluation: Objective:  Appearance: Casual  Eye Contact::  Good  Speech:  Clear and Coherent  Volume:  Normal  Mood:  Euthymic  Affect:  Appropriate  Thought Process:  Coherent and Intact  Orientation:  Full  Thought Content:  Rumination  Suicidal Thoughts:  No  Homicidal Thoughts:  No  Memory:  Immediate;   Good Recent;   Good Remote;   Good  Judgement:  Good  Insight:  Good  Psychomotor Activity:  Normal  Concentration:  Good  Recall:  Good  Akathisia:  No  Handed:  Right  AIMS (if indicated):     Assets:  Desire for Improvement  Sleep:  Number of Hours: 5.75    Vital Signs:Blood pressure 98/66, pulse 66, temperature 96.7 F (35.9 C), temperature source Oral, resp. rate 16, height 5\' 1"  (1.549 m), weight 56.246 kg (124 lb). Current Medications: Current Facility-Administered Medications  Medication Dose Route Frequency Provider Last Rate Last Dose  . acetaminophen (TYLENOL) tablet 650 mg  650 mg Oral Q6H PRN Mickie D. Adams, PA   650 mg at 07/26/12 2001  . albuterol (PROVENTIL HFA;VENTOLIN HFA) 108 (90 BASE) MCG/ACT inhaler 2 puff  2 puff  Inhalation Q6H PRN Mickie D. Adams, PA      . alum & mag hydroxide-simeth (MAALOX/MYLANTA) 200-200-20 MG/5ML suspension 30 mL  30 mL Oral Q4H PRN Mickie D. Pernell Dupre, PA      . DULoxetine (CYMBALTA) DR capsule 60 mg  60 mg Oral Daily Mickie D. Adams, PA   60 mg at 07/29/12 0831  . fluticasone (FLONASE) 50 MCG/ACT nasal spray 2 spray  2 spray Each Nare Daily Mickie D. Adams, PA   2 spray at 07/29/12 0831  . hydrOXYzine (ATARAX/VISTARIL) tablet 50 mg  50 mg Oral QHS PRN,MR X 1 Mickie D. Adams, PA   50 mg at 07/27/12 2107  . ibuprofen (ADVIL,MOTRIN) tablet 800 mg  800 mg Oral Q6H PRN Mickie D. Adams, PA   800 mg at 07/28/12 2246  . magnesium hydroxide (MILK OF MAGNESIA) suspension 30 mL  30 mL Oral Daily PRN Mickie D. Adams, PA      . nicotine (NICODERM CQ - dosed in mg/24 hours) patch 21 mg  21 mg Transdermal Q0600 Mickie D. Adams, PA   21 mg at 07/29/12 4540  . traZODone (DESYREL) tablet 50 mg  50 mg Oral QHS Sanjuana Kava, NP   50 mg at 07/28/12 2246  . venlafaxine XR (EFFEXOR-XR) 24 hr capsule 75 mg  75 mg Oral Q breakfast Dorian Heckle  Dan Humphreys, MD   75 mg at 07/29/12 6295   Followed by  . venlafaxine XR (EFFEXOR-XR) 24 hr capsule 150 mg  150 mg Oral Q breakfast Mike Craze, MD        Lab Results:  Results for orders placed during the hospital encounter of 07/26/12 (from the past 48 hour(s))  DRUGS OF ABUSE SCREEN W ALC, ROUTINE URINE     Status: Normal   Collection Time   07/27/12  2:39 PM      Component Value Range Comment   Amphetamine Screen, Ur NEGATIVE  Negative    Marijuana Metabolite NEGATIVE  Negative    Barbiturate Quant, Ur NEGATIVE  Negative    Methadone NEGATIVE  Negative    Propoxyphene NEGATIVE  Negative    Benzodiazepines. NEGATIVE  Negative    Phencyclidine (PCP) NEGATIVE  Negative    Cocaine Metabolites NEGATIVE  Negative    Opiate Screen, Urine NEGATIVE  Negative    Ethyl Alcohol <10  <10 mg/dL    Creatinine,U 28.4       Physical Findings: AIMS: Facial and Oral  Movements Muscles of Facial Expression: None, normal Lips and Perioral Area: None, normal Jaw: None, normal Tongue: None, normal,Extremity Movements Upper (arms, wrists, hands, fingers): None, normal Lower (legs, knees, ankles, toes): None, normal, Trunk Movements Neck, shoulders, hips: None, normal, Overall Severity Severity of abnormal movements (highest score from questions above): None, normal Incapacitation due to abnormal movements: None, normal Patient's awareness of abnormal movements (rate only patient's report): No Awareness, Dental Status Current problems with teeth and/or dentures?: No Does patient usually wear dentures?: No  CIWA:  CIWA-Ar Total: 0  COWS:     Treatment Plan Summary: Daily contact with patient to assess and evaluate symptoms and progress in treatment Medication management  Plan: No changes made on the current treatment regimen. Continue current treatment plan.  [name] I 07/29/2012, 2:05 PM

## 2012-07-29 NOTE — Progress Notes (Signed)
Progress note was performed by physician extender and as a supervising MD, available for assistance near by during this rounds. Agree with the findings.   

## 2012-07-29 NOTE — Progress Notes (Signed)
D Kim cont to struggle to find peace with her situation. She is sad, depressed and frequently tearful today. She is engaged in her recovery, but demonstrates limited understanding  of where her feelings of disrespect come from....and struggles with accepting that she cannot control certain people's behavior.             A SHe attends her groups, is engaged in her recovery and she completed her self inventory and on it she wrote she denied SI within the past 24 hrs and then she rated her depression and hopelessness "5/5".              R Safety is in place and POC includes fostering  therapeutic relationship already established PD RN [facility]

## 2012-07-29 NOTE — Progress Notes (Signed)
The pt. attended the A.A. Meeting on the 300 hallway.

## 2012-07-29 NOTE — Progress Notes (Signed)
Psychoeducational Group Note  Date:  07/29/2012 Time:  0930  Group Topic/Focus:  Spirituality:   The focus of this group is to discuss how one's spirituality can aide in recovery.  Participation Level:  Did Not Attend  Participation Quality:  Did not attend  Affect:  Appropriate  Cognitive:  Alert and Appropriate  Insight:  Did not attend  Engagement in Group:  Did not Attend  Additional Comments:  Did not attend   Meredith Staggers 07/29/2012, 11:21 AM

## 2012-07-29 NOTE — Progress Notes (Signed)
Patient attended AA group. Patient has been up and active on the unit, patient informed of her hs medication and is agreeable to taking trazadone. Patient reports that she has been having trouble sleeping and hope this will help. Patient c/o left arm pain but wants to take her ibuprofen with her trazadone. Patient offered support and encouragement, informed that she could take the ibuprofen now but she reported that she wanted to wait. Safety maintained on unit, will continue to monitor.
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
## 2012-07-30 MED ORDER — PANTOPRAZOLE SODIUM 20 MG PO TBEC
20.0000 mg | DELAYED_RELEASE_TABLET | Freq: Two times a day (BID) | ORAL | Status: DC
  Administered 2012-07-31 – 2012-08-01 (×2): 20 mg via ORAL
  Filled 2012-07-30: qty 1
  Filled 2012-07-30: qty 28
  Filled 2012-07-30 (×3): qty 1
  Filled 2012-07-30: qty 28

## 2012-07-30 MED ORDER — GABAPENTIN 100 MG PO CAPS
100.0000 mg | ORAL_CAPSULE | Freq: Three times a day (TID) | ORAL | Status: DC
  Administered 2012-07-30 – 2012-08-01 (×6): 100 mg via ORAL
  Filled 2012-07-30: qty 1
  Filled 2012-07-30: qty 42
  Filled 2012-07-30 (×6): qty 1
  Filled 2012-07-30: qty 42
  Filled 2012-07-30: qty 1
  Filled 2012-07-30: qty 42
  Filled 2012-07-30 (×2): qty 1

## 2012-07-30 MED ORDER — DOCUSATE SODIUM 100 MG PO CAPS: 100.0000 mg | ORAL_CAPSULE | Freq: Two times a day (BID) | ORAL | Status: DC | PRN

## 2012-07-30 MED ORDER — MELOXICAM 7.5 MG PO TABS
15.0000 mg | ORAL_TABLET | Freq: Every day | ORAL | Status: DC
  Administered 2012-07-30 – 2012-08-01 (×3): 15 mg via ORAL
  Filled 2012-07-30 (×2): qty 1
  Filled 2012-07-30: qty 28
  Filled 2012-07-30: qty 1
  Filled 2012-07-30: qty 28
  Filled 2012-07-30: qty 2
  Filled 2012-07-30: qty 1
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
## 2012-07-30 NOTE — Progress Notes (Signed)
[facility] MD Progress Note  07/30/2012 6:40 PM  Diagnosis:   Axis I: Major Depression, Recurrent severe and Compulsive behavior disorder Axis II: Deferred Axis III:  Past Medical History  Diagnosis Date  . Anxiety   . Mental disorder   . Depression   . COPD (chronic obstructive pulmonary disease)   . Shortness of breath   . GERD (gastroesophageal reflux disease)   . Headache   . Cancer   . Arthritis    Axis IV: other psychosocial or environmental problems Axis V: 51-60 moderate symptoms  ADL's:  Intact  Sleep: Good  Appetite:  Good  Suicidal Ideation:  Pt quite depressed and feeling hopeless Homicidal Ideation:  Pt denies any thoughts, plans, intent of homicide  AEB (as evidenced by): per pt report  Mental Status Examination/Evaluation: Objective:  Appearance: Casual  Eye Contact::  Good  Speech:  Clear and Coherent  Volume:  Normal  Mood:  Anxious, actually has taken Ativan for a long time for anxiety.  None on UDS, will try Neurontin for this and to see if it might help her pain problems.  Affect:  Congruent  Thought Process:  Coherent  Orientation:  Full  Thought Content:  WDL  Suicidal Thoughts:  No  Homicidal Thoughts:  No  Memory:  Immediate;   Good Recent;   Good Remote;   Good  Judgement:  Fair  Insight:  Fair  Psychomotor Activity:  Normal  Concentration:  Fair  Recall:  Fair  Akathisia:  No  Handed:  Right  AIMS (if indicated):     Assets:  Communication Skills Desire for Improvement  Sleep:  Number of Hours: 6.75    ROS: Neuro: no headaches, ataxia, weakness  GI: no N/V/D/cramps, noting some constipation asked to increase fluids and walk in the hall more.  Colace PRN ordered.  MS: no weakness, muscle cramps, noting some problems with her (R) elbow after ORIF of ulna and radial head surgery, Mobic ordered for this as well as hip pain, unknown cause, but could be postion related as it only is a problem when laying down.    Vital Signs:Blood  pressure 99/69, pulse 69, temperature 97.4 F (36.3 C), temperature source Oral, resp. rate 19, height 5\' 1"  (1.549 m), weight 56.246 kg (124 lb). Current Medications: Current Facility-Administered Medications  Medication Dose Route Frequency Provider Last Rate Last Dose  . acetaminophen (TYLENOL) tablet 650 mg  650 mg Oral Q6H PRN Mickie D. Adams, PA   650 mg at 07/30/12 1154  . albuterol (PROVENTIL HFA;VENTOLIN HFA) 108 (90 BASE) MCG/ACT inhaler 2 puff  2 puff Inhalation Q6H PRN Mickie D. Adams, PA      . alum & mag hydroxide-simeth (MAALOX/MYLANTA) 200-200-20 MG/5ML suspension 30 mL  30 mL Oral Q4H PRN Mickie D. Adams, PA      . docusate sodium (COLACE) capsule 100 mg  100 mg Oral BID PRN Mike Craze, MD      . DULoxetine (CYMBALTA) DR capsule 60 mg  60 mg Oral Daily Mickie D. Adams, PA   60 mg at 07/30/12 0843  . fluticasone (FLONASE) 50 MCG/ACT nasal spray 2 spray  2 spray Each Nare Daily Mickie D. Adams, PA   2 spray at 07/30/12 [phone]  . gabapentin (NEURONTIN) capsule 100 mg  100 mg Oral TID Mike Craze, MD      . hydrOXYzine (ATARAX/VISTARIL) tablet 50 mg  50 mg Oral QHS PRN,MR X 1 Mickie D. Adams, PA   50 mg at  07/27/12 2107  . ibuprofen (ADVIL,MOTRIN) tablet 800 mg  800 mg Oral Q6H PRN Mickie D. Adams, PA   800 mg at 07/29/12 1819  . magnesium hydroxide (MILK OF MAGNESIA) suspension 30 mL  30 mL Oral Daily PRN Mickie D. Adams, PA      . meloxicam (MOBIC) tablet 15 mg  15 mg Oral Daily Mike Craze, MD   15 mg at 07/30/12 1814  . nicotine (NICODERM CQ - dosed in mg/24 hours) patch 21 mg  21 mg Transdermal Q0600 Mickie D. Adams, PA   21 mg at 07/30/12 0606  . pantoprazole (PROTONIX) EC tablet 20 mg  20 mg Oral BID AC Mike Craze, MD      . traZODone (DESYREL) tablet 50 mg  50 mg Oral QHS Sanjuana Kava, NP   50 mg at 07/29/12 2149  . venlafaxine XR (EFFEXOR-XR) 24 hr capsule 150 mg  150 mg Oral Q breakfast Mike Craze, MD   150 mg at 07/30/12 1610    Lab Results:  No results  found for this or any previous visit (from the past 48 hour(s)).  Physical Findings: AIMS: Facial and Oral Movements Muscles of Facial Expression: None, normal Lips and Perioral Area: None, normal Jaw: None, normal Tongue: None, normal,Extremity Movements Upper (arms, wrists, hands, fingers): None, normal Lower (legs, knees, ankles, toes): None, normal, Trunk Movements Neck, shoulders, hips: None, normal, Overall Severity Severity of abnormal movements (highest score from questions above): None, normal Incapacitation due to abnormal movements: None, normal Patient's awareness of abnormal movements (rate only patient's report): No Awareness, Dental Status Current problems with teeth and/or dentures?: No Does patient usually wear dentures?: No  CIWA:  CIWA-Ar Total: 0  COWS:  COWS Total Score: 2   Treatment Plan Summary: Daily contact with patient to assess and evaluate symptoms and progress in treatment Medication management Mood/anxiety less than 3/10 where the scale is 1 is the best and 10 is the worst No suicidal or homicidal thoughts for at least 48 hours.  Plan: Add Mobic (with Protonix) for arthritic pain in (R) elbow.  Add Neurontin for anxiety and benzo detox as she has not been on her Ativan for a few days, but had been on it daily for a long time up until the last few days.  Encouraging her to attend 12 Step programs.  Tiarna Koppen 07/30/2012, 6:40 PM

## 2012-07-30 NOTE — Progress Notes (Signed)
Patient ID: Martha Jordan, female   DOB: 06/12/1966, 46 y.o.   MRN: [id number]  D: Patient pleasant on approach this evening. Reports mood ok tonight. Talking on phone a lot tonight. Attended AA meeting on the 300 hall. Started on neurontin tonight stating that the physician wants to get her off ativan and take this in it's place for anxiety. Currently denies any SI at this time. Getting along well with other peers and had visitors tonight.  A: Staff will monitor on q 15 minute checks and follow treatments and give meds as ordered.  R: Patient states that she will come back for her trazodone when she is more ready for sleep.

## 2012-07-30 NOTE — Progress Notes (Addendum)
On patient's self inventory sheet, patient sleeps well, has good appetite, low energy level, good attention span.   Rated depression and hopelessness #4-5.  Denied SI.  Denied physical pain.  No problems taking meds after discharge. Patient has been cooperative and pleasant.  Stated she feels very tired.   Denied SI and HI.   Denied A/V hallucinations.   Denied pain.   Patient stated she has been going to dining room for meals today.  Has attended all groups.   Patient was given ginger ale for nervous stomach after dinner. Patient has been cooperative and pleasant today.

## 2012-07-30 NOTE — Progress Notes (Signed)
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   Christ Kick 07/30/2012, 2:24 AM

## 2012-07-30 NOTE — Progress Notes (Signed)
Psychoeducational Group Note  Date:  07/30/2012 Time:  1100  Group Topic/Focus:  Wellness Toolbox:   The focus of this group is to discuss various aspects of wellness, balancing those aspects and exploring ways to increase the ability to experience wellness.  Patients will create a wellness toolbox for use upon discharge.  Participation Level:  Active  Participation Quality:  Appropriate  Affect:  Appropriate  Cognitive:  Appropriate  Insight:  Good  Engagement in Group:  Good  Additional Comments:  none  Basya Casavant, Genia Plants 07/30/2012, 12:13 PM

## 2012-07-30 NOTE — Tx Team (Signed)
Interdisciplinary Treatment Plan Update (Adult)  Date:  07/30/2012  Time Reviewed:  11:04 AM   Progress in Treatment: Attending groups: Yes Participating in groups:  Yes Taking medication as prescribed: Yes Tolerating medication:  Yes Family/Significant other contact made:  Counselor assessing for appropriate contact Patient understands diagnosis:  Yes Discussing patient identified problems/goals with staff:  Yes Medical problems stabilized or resolved:  Yes Denies suicidal/homicidal ideation: Yes Issues/concerns per patient self-inventory:  None identified Other: N/A  New problem(s) identified: None Identified  Reason for Continuation of Hospitalization: Anxiety Depression Medication stabilization Suicidal ideation  Interventions implemented related to continuation of hospitalization: mood stabilization, medication monitoring and adjustment, group therapy and psycho education, safety checks q 15 mins  Additional comments: N/A  Estimated length of stay: 3-5 days  Discharge Plan: SW will refer pt to [facility] for medication management and therapy.    New goal(s): N/A  Review of initial/current patient goals per problem list:   1. Goal(s): Reduce depressive symptoms  Met: No  Target date: by discharge  As evidenced by: Reducing depression from a 10 to a 3 as reported by pt.  Pt rates at 4 today.    2. Goal (s): Reduce/Eliminate suicidal ideation  Met: No  Target date: by discharge  As evidenced by: pt reporting no SI.   3. Goal(s): Reduce anxiety symptoms  Met: No  Target date: by discharge  As evidenced by: Reduce anxiety from a 10 to a 3 as reported by pt. Pt rates at a 5-6 today.     Attendees: Patient:     Family:     Physician:  Orson Aloe, MD  07/30/2012  11:04 AM   Nursing:   Quintella Reichert, RN 07/30/2012 11:05 AM   Case Manager:  Reyes Ivan, LCSWA 07/30/2012  11:04 AM   Counselor:  Angus Palms, LCSW 07/30/2012  11:04 AM   Other:  Juline Patch,  LCSW 07/30/2012  11:04 AM   Other:  Lawson Fiscal, RN 07/30/2012 11:05 AM   Other:     Other:      Scribe for Treatment Team:   Carmina Miller, 07/30/2012 , 11:04 AM

## 2012-07-30 NOTE — Progress Notes (Signed)
Pt attended discharge planning group and actively participated in group.  SW provided pt with today's workbook.  Pt presents with calm mood and affect.  Pt rates depression at a 4 and anxiety at a 5-6 today.  Pt denies SI.  Pt appears to be doing better.  Pt states that she is hopeful she can d/c tomorrow or Wednesday because she is supposed to start online classes on Thursday.  Pt will return home and has transportation home.  Pt will follow up with [facility] for medication management and therapy.  No further needs voiced by pt at this time.    Reyes Ivan, LCSWA 07/30/2012  1:31 PM

## 2012-07-31 MED ORDER — DOCUSATE SODIUM 100 MG PO CAPS
100.0000 mg | ORAL_CAPSULE | Freq: Two times a day (BID) | ORAL | Status: DC
  Administered 2012-07-31 – 2012-08-01 (×2): 100 mg via ORAL
  Filled 2012-07-31: qty 1
  Filled 2012-07-31 (×2): qty 20
  Filled 2012-07-31 (×3): qty 1

## 2012-07-31 NOTE — Progress Notes (Signed)
Psychoeducational Group Note  Date:  07/31/2012 Time:  1000  Group Topic/Focus:  Therapeutic Activity- "Apples to Apples"  Participation Level: Did Not Attend  Participation Quality:  Not Applicable  Affect:  Not Applicable  Cognitive:  Not Applicable  Insight:  Not Applicable  Engagement in Group: Not Applicable  Additional Comments:  Pt did not attend group and remained lying in bed.   Sharyn Lull 07/31/2012, 10:44 AM

## 2012-07-31 NOTE — Progress Notes (Addendum)
Patient self inventory sheet, patient sleeps well, has good appetite, normal energy level, good attention span.  Rated depression and hopelessness #1.  Denied withdrawals.   Denied SI.  Denied physical pain.  After discharge, plans to follow up, stay on meds, avoid negative people.  Does have discharge plans.  Will have a difficult paying for her medications after discharge. Patient has attended groups today.   Patient has been cooperative and pleasant.

## 2012-07-31 NOTE — Progress Notes (Signed)
[name] MD Progress Note  07/31/2012 3:15 PM  S: "I'm feeling very well today. I think that my medications are kicking in already. I wish can just used the bathroom. My last BM was last Friday".  Diagnosis:   Axis I: Major depressive disorder, recurrent episode Axis II: Deferred Axis III:  Past Medical History  Diagnosis Date  . Anxiety   . Mental disorder   . Depression   . COPD (chronic obstructive pulmonary disease)   . Shortness of breath   . GERD (gastroesophageal reflux disease)   . Headache   . Cancer   . Arthritis    Axis IV: other psychosocial or environmental problems Axis V: 41-50 serious symptoms  ADL's:  Intact  Sleep: Good  Appetite:  Good  Suicidal Ideation: "No" Plan:  No Intent:  no Means:  no Homicidal Ideation:  Plan:  No Intent:  No Means:  no  AEB (as evidenced by): Per patient's report.  Mental Status Examination/Evaluation: Objective:  Appearance: Casual  Eye Contact::  Good  Speech:  Clear and Coherent  Volume:  Normal  Mood:  Euthymic  Affect:  Appropriate  Thought Process:  Coherent and Intact  Orientation:  Full  Thought Content:  Rumination  Suicidal Thoughts:  No  Homicidal Thoughts:  No  Memory:  Immediate;   Good Recent;   Good Remote;   Good  Judgement:  Fair  Insight:  Fair  Psychomotor Activity:  Normal  Concentration:  Good  Recall:  Good  Akathisia:  No  Handed:  Right  AIMS (if indicated):     Assets:  Desire for Improvement  Sleep:  Number of Hours: 6    Vital Signs:Blood pressure 100/63, pulse 75, temperature 97.8 F (36.6 C), temperature source Oral, resp. rate 20, height 5\' 1"  (1.549 m), weight 56.246 kg (124 lb). Current Medications: Current Facility-Administered Medications  Medication Dose Route Frequency Provider Last Rate Last Dose  . acetaminophen (TYLENOL) tablet 650 mg  650 mg Oral Q6H PRN Mickie D. Adams, PA   650 mg at 07/30/12 1154  . albuterol (PROVENTIL HFA;VENTOLIN HFA) 108 (90 BASE) MCG/ACT  inhaler 2 puff  2 puff Inhalation Q6H PRN Mickie D. Adams, PA      . alum & mag hydroxide-simeth (MAALOX/MYLANTA) 200-200-20 MG/5ML suspension 30 mL  30 mL Oral Q4H PRN Mickie D. Adams, PA      . docusate sodium (COLACE) capsule 100 mg  100 mg Oral BID PRN Mike Craze, MD      . DULoxetine (CYMBALTA) DR capsule 60 mg  60 mg Oral Daily Mickie D. Adams, PA   60 mg at 07/31/12 0819  . fluticasone (FLONASE) 50 MCG/ACT nasal spray 2 spray  2 spray Each Nare Daily Mickie D. Adams, PA   2 spray at 07/31/12 0818  . gabapentin (NEURONTIN) capsule 100 mg  100 mg Oral TID Mike Craze, MD   100 mg at 07/31/12 1437  . hydrOXYzine (ATARAX/VISTARIL) tablet 50 mg  50 mg Oral QHS PRN,MR X 1 Mickie D. Adams, PA   50 mg at 07/27/12 2107  . ibuprofen (ADVIL,MOTRIN) tablet 800 mg  800 mg Oral Q6H PRN Mickie D. Adams, PA   800 mg at 07/29/12 1819  . magnesium hydroxide (MILK OF MAGNESIA) suspension 30 mL  30 mL Oral Daily PRN Mickie D. Adams, PA   30 mL at 07/31/12 1478  . meloxicam (MOBIC) tablet 15 mg  15 mg Oral Daily Mike Craze, MD   15  mg at 07/31/12 0820  . nicotine (NICODERM CQ - dosed in mg/24 hours) patch 21 mg  21 mg Transdermal Q0600 Mickie D. Adams, PA   21 mg at 07/31/12 0618  . pantoprazole (PROTONIX) EC tablet 20 mg  20 mg Oral BID AC Mike Craze, MD      . traZODone (DESYREL) tablet 50 mg  50 mg Oral QHS Sanjuana Kava, NP   50 mg at 07/30/12 2246  . venlafaxine XR (EFFEXOR-XR) 24 hr capsule 150 mg  150 mg Oral Q breakfast Mike Craze, MD   150 mg at 07/31/12 1610    Lab Results: No results found for this or any previous visit (from the past 48 hour(s)).  Physical Findings: AIMS: Facial and Oral Movements Muscles of Facial Expression: None, normal Lips and Perioral Area: None, normal Jaw: None, normal Tongue: None, normal,Extremity Movements Upper (arms, wrists, hands, fingers): None, normal Lower (legs, knees, ankles, toes): None, normal, Trunk Movements Neck, shoulders, hips:  None, normal, Overall Severity Severity of abnormal movements (highest score from questions above): None, normal Incapacitation due to abnormal movements: None, normal Patient's awareness of abnormal movements (rate only patient's report): No Awareness, Dental Status Current problems with teeth and/or dentures?: No Does patient usually wear dentures?: No  CIWA:  CIWA-Ar Total: 0  COWS:  COWS Total Score: 2   Treatment Plan Summary: Daily contact with patient to assess and evaluate symptoms and progress in treatment Medication management  Plan: Change dosing for colace 100 mg from PRN to bid routinely. Continue treatments as ordered.  [name] I 07/31/2012, 3:15 PM

## 2012-07-31 NOTE — Progress Notes (Signed)
[facility] Group Notes: (Counselor/Nursing/MHT/Case Management/Adjunct) 07/31/2012   @  11:00am  Finding Balance in Life  Type of Therapy:  Group Therapy  Participation Level:  Active  Participation Quality: Appropriate, Sharing    Affect:  Blunted  Cognitive:  Appropriate  Insight:  Good  Engagement in Group: Good  Engagement in Therapy:  Good  Modes of Intervention:  Support and Exploration  Summary of Progress/Problems: Selena Batten was attentive but quiet through the first half of group. She then began to open up, sharing that her life gets out of balance due to the guilt and responsibility she accepts from others. She processed the situation that took place when her father was sick - she was expected to take care of him and did so until he died. Now she is expected to take care of her mother, but does not want to do so because of the pressure it places on her. Selena Batten stated that she feels guilty for letting people down if she does not do what they want, but recognizing that she is going to have to set boundaries about what responsibility she will and will not accept. She went on to state that her expectations of herself are way too high, and that she has also allowed others to place unrealistic expectations on her by always doing what they asked of her. Now she sees that she cannot live up to these expectations, but imagines she is a failure for this. Kim seemed receptive to the concept of accepting that where she is in life right now is where she needs to be, rather than dwelling over things she could have done differently or judging herself against some out of sight ideal.  Billie Lade 07/31/2012   3:51 PM

## 2012-07-31 NOTE — Progress Notes (Signed)
Psychoeducational Group Note  Date:  07/31/2012 Time:  2000  Group Topic/Focus:  Wrap-Up Group:   The focus of this group is to help patients review their daily goal of treatment and discuss progress on daily workbooks.  Participation Level:  Active  Participation Quality:  Alert  Affect:    Cognitive:    Insight:    Engagement in Group:    Additional Comments:  Patient did attend AA meeting tonight.  Martha Jordan, Newton Pigg 07/31/2012, 8:32 PM

## 2012-07-31 NOTE — Progress Notes (Signed)
[facility] Group Notes:  (Counselor/Nursing/MHT/Case Management/Adjunct)  07/31/2012 1:49 PM  Type of Therapy:  Recovery Group:The focus of this group is to identify appropriate goals for recovery and and to establish a plan to achieve them.     Participation Level:  Did Not Attend    Summary of Progress/Problems:Pt. Did not attend group.   Ardelle Park O 07/31/2012, 1:49 PM

## 2012-07-31 NOTE — Progress Notes (Signed)
Pt attended discharge planning group and actively participated in group.  SW provided pt with today's workbook.  Pt presents with calm mood and affect.  Pt rates depression at a 0 and anxiety at a 3 today.  Pt denies SI.  Pt reports feeling ready to d/c and is hopeful she will be able to d/c today or tomorrow to get home for her online classes.  Pt will follow up at [facility] for medication management and therapy.  No further needs voiced by pt at this time.  Safety planning and suicide prevention discussed.  Pt participated in discussion and acknowledged an understanding of the information provided.      Reyes Ivan, LCSWA 07/31/2012  10:45 AM

## 2012-07-31 NOTE — Progress Notes (Signed)
Met with pt just before she went to AA group on the [facility] hall.  She took her 8pm Neurontin 100mg  at this time.  She states she believes it is helping her.  She reports she is feeling better and believes she is ready to discharge home.  She voices not complaints/concerns.  She is anxious about going home and whether she will be able to get her disability benefits.  Encouraged pt to take one day at a time and ask for help when she is overwhelmed.  Pt voices appreciation for help received here.  Pt denies SI/HI/AV.  Safety maintained with q15 minute checks.

## 2012-08-01 DIAGNOSIS — F411 Generalized anxiety disorder: Secondary | ICD-10-CM

## 2012-08-01 DIAGNOSIS — F17200 Nicotine dependence, unspecified, uncomplicated: Secondary | ICD-10-CM

## 2012-08-01 DIAGNOSIS — F332 Major depressive disorder, recurrent severe without psychotic features: Principal | ICD-10-CM

## 2012-08-01 DIAGNOSIS — F429 Obsessive-compulsive disorder, unspecified: Secondary | ICD-10-CM

## 2012-08-01 MED ORDER — TRAZODONE HCL 50 MG PO TABS: 50.0000 mg | ORAL_TABLET | Freq: Every day | ORAL | Status: DC

## 2012-08-01 MED ORDER — MELOXICAM 15 MG PO TABS: 15.0000 mg | ORAL_TABLET | Freq: Every day | ORAL | Status: DC

## 2012-08-01 MED ORDER — DSS 100 MG PO CAPS: 100.0000 mg | ORAL_CAPSULE | Freq: Two times a day (BID) | ORAL | Status: AC

## 2012-08-01 MED ORDER — DULOXETINE HCL 60 MG PO CPEP: 60.0000 mg | ORAL_CAPSULE | Freq: Every day | ORAL | Status: DC

## 2012-08-01 MED ORDER — GABAPENTIN 100 MG PO CAPS: 100.0000 mg | ORAL_CAPSULE | Freq: Three times a day (TID) | ORAL | Status: DC

## 2012-08-01 MED ORDER — PANTOPRAZOLE SODIUM 20 MG PO TBEC: 20.0000 mg | DELAYED_RELEASE_TABLET | Freq: Two times a day (BID) | ORAL | Status: DC

## 2012-08-01 MED ORDER — NICOTINE 21 MG/24HR TD PT24: 1.0000 | MEDICATED_PATCH | Freq: Every day | TRANSDERMAL | Status: AC

## 2012-08-01 MED ORDER — VENLAFAXINE HCL ER 150 MG PO CP24: 150.0000 mg | ORAL_CAPSULE | Freq: Every day | ORAL | Status: DC

## 2012-08-01 MED ORDER — HYDROXYZINE HCL 50 MG PO TABS: 50.0000 mg | ORAL_TABLET | Freq: Every evening | ORAL | Status: AC | PRN

## 2012-08-01 MED ORDER — FLUTICASONE PROPIONATE 50 MCG/ACT NA SUSP: 2.0000 | Freq: Every day | NASAL | Status: DC

## 2012-08-01 NOTE — Progress Notes (Addendum)
08/01/2012         Time: 1500      Group Topic/Focus: The focus of this group is on enhancing the patient's understanding of leisure, barriers to leisure, and the importance of engaging in positive leisure activities upon discharge for improved total health.  Participation Level: Did not attend  Participation Quality: Not Applicable  Affect: Not Applicable  Cognitive: Not Applicable   Additional Comments: Patient preparing for discharge.  Woodie Trusty 08/01/2012 3:44 PM

## 2012-08-01 NOTE — Progress Notes (Signed)
Psychoeducational Group Note  Date:  08/01/2012 Time:  1100   Group Topic/Focus:  Goals Group:   The focus of this group is to help patients establish daily goals to achieve during treatment and discuss how the patient can incorporate goal setting into their daily lives to aide in recovery.  Participation Level:  Active  Participation Quality:  Appropriate  Affect:  Appropriate  Cognitive:  Appropriate  Insight:  Good  Engagement in Group:  Good  Additional Comments:  Pt participated during group. Pt made appropriate comments when asked about values and goals in group.  Lenox Ahr 08/01/2012, 1:57 PM

## 2012-08-01 NOTE — Discharge Summary (Signed)
I agree with this D/C Summary.  

## 2012-08-01 NOTE — Progress Notes (Signed)
[facility] Group Notes:  (Counselor/Nursing/MHT/Case Management/Adjunct)  08/01/2012 11:47 AM  Type of Therapy:  Psychoeducational Skills  Participation Level:  Minimal  Participation Quality:  Appropriate and Attentive  Affect:  Appropriate  Cognitive:  Alert, Appropriate and Oriented  Insight:  Good  Engagement in Group:  Good  Engagement in Therapy:  n/a  Modes of Intervention:  Activity, Education, Problem-solving, Socialization and Support  Summary of Progress/Problems: Martha Jordan attended Psychoeducational group that focused on using quality time with support systems/individuals to engage in healthy coping skills. Martha Jordan participated in activity guessing about self and peers. Martha Jordan was quiet but attentive while group discussed who their supports are, how they can spend positive quality time with them as a coping skill and a way to strengthen their relationship. Martha Jordan was given a homework assignment to find two ways to improve her support systems and 20 activities she can do to spend quality time with supports.    Wandra Scot 08/01/2012, 11:47 AM

## 2012-08-01 NOTE — Progress Notes (Signed)
[facility] Group Notes: (Counselor/Nursing/MHT/Case Management/Adjunct)  08/01/2012 1:15-2:30pm  Emotion Regulation  Type of Therapy: Group Therapy   Participation Level: Limited   Participation Quality: Attentive, Supportive   Affect: Blunted   Cognitive: Appropriate   Insight: Limited   Engagement in Group: Limited   Engagement in Therapy: Limited   Modes of Intervention: Support and Exploration   Summary of Progress/Problems: Martha Jordan was attentive and appropriate, and seemed to relate to the statements of  others, though she did not share her own experiences. She indicated that she finds herself being overwhelmed by anxiety and sadness at times, and talked about how those emotions feel in her body.   Angus Palms, LCSW  08/01/2012 2:33 PM

## 2012-08-01 NOTE — [facility] Suicide Risk Assessment (Signed)
Suicide Risk Assessment  Discharge Assessment     Demographic factors:  Divorced or widowed;Caucasian;Low socioeconomic status    Current Mental Status Per Nursing Assessment::   On Admission:  Suicidal ideation indicated by patient;Self-harm thoughts At Discharge:     Current Mental Status Per Physician: Patient denies suicidal or homicidal ideation, hallucinations, illusions, or delusions. Patient engages with good eye contact, is able to focus adequately in a one to one setting, and has clear goal directed thoughts. Patient speaks with a natural conversational volume, rate, and tone. Anxiety was reported at 3 on a scale of 1 the least and 10 the most. Depression was reported at 1 on the same scale. Patient is oriented times 4, recent and remote memory intact. Judgement: limited by her depression and addictions Insight: also limited by those conditions  Loss Factors: Loss of significant relationship;Legal issues;Financial problems / change in socioeconomic status  Historical Factors: Family history of suicide  Continued Clinical Symptoms:  Severe Anxiety and/or Agitation Depression:   Anhedonia Alcohol/Substance Abuse/Dependencies Previous Psychiatric Diagnoses and Treatments  Discharge Diagnoses:   AXIS I:  Anxiety Disorder NOS, Major Depression, Recurrent severe and Compulsive behavior disorder (codependence) Nicotine Dependence AXIS II:  Deferred AXIS III:   Past Medical History  Diagnosis Date  . Anxiety   . Mental disorder   . Depression   . COPD (chronic obstructive pulmonary disease)   . Shortness of breath   . GERD (gastroesophageal reflux disease)   . Headache   . Cancer   . Arthritis    AXIS IV:  other psychosocial or environmental problems AXIS V:  61-70 mild symptoms  Cognitive Features That Contribute To Risk:  Thought constriction (tunnel vision)    Suicide Risk:  Minimal: No identifiable suicidal ideation.  Patients presenting with no risk  factors but with morbid ruminations; may be classified as minimal risk based on the severity of the depressive symptoms  Labs: No results found for this or any previous visit (from the past 72 hour(s)).  RISK REDUCTION FACTORS: What pt has learned from hospital stay is a different way to look at their problems (able to let go of her guilt feelings and able to put the anger where it should be) and learned to talk to people.  Risk of self harm is elevated by her depression and her addictions, but has decided that she has her family and herself to live for.  Risk of harm to others is minimal in that she has not been involved in fights or had any legal charges filed on her.  Pt seen in treatment team where she divulged the above information. The treatment team concluded that she was ready for discharge and had met her goals for an inpatient setting.  PLAN: Discharge home Continue Medication List  As of 08/01/2012  9:42 AM   STOP taking these medications         LORazepam 1 MG tablet         TAKE these medications      Indication    albuterol 108 (90 BASE) MCG/ACT inhaler   Commonly known as: PROVENTIL HFA;VENTOLIN HFA   Inhale 2 puffs into the lungs every 6 (six) hours as needed. For shortess of breath       DSS 100 MG Caps   Take 100 mg by mouth 2 (two) times daily. For constipation       DULoxetine 60 MG capsule   Commonly known as: CYMBALTA   Take 1 capsule (60 mg  total) by mouth daily. For depression and pain management.       fluticasone 50 MCG/ACT nasal spray   Commonly known as: FLONASE   Place 2 sprays into the nose daily. For allergies       gabapentin 100 MG capsule   Commonly known as: NEURONTIN   Take 1 capsule (100 mg total) by mouth 3 (three) times daily. For anxiety       hydrOXYzine 50 MG tablet   Commonly known as: ATARAX/VISTARIL   Take 1 tablet (50 mg total) by mouth at bedtime as needed and may repeat dose one time if needed (insomnia).       ibuprofen  200 MG tablet   Commonly known as: ADVIL,MOTRIN   Take 800 mg by mouth every 6 (six) hours as needed. For pain       meloxicam 15 MG tablet   Commonly known as: MOBIC   Take 1 tablet (15 mg total) by mouth daily. For inflammation       pantoprazole 20 MG tablet   Commonly known as: PROTONIX   Take 1 tablet (20 mg total) by mouth 2 (two) times daily before a meal. For control of stomach acid secretion and helps GERD. Keeps Mobic from hurting the stomach.       traZODone 50 MG tablet   Commonly known as: DESYREL   Take 1 tablet (50 mg total) by mouth at bedtime. For insomnia.       venlafaxine XR 150 MG 24 hr capsule   Commonly known as: EFFEXOR-XR   Take 1 capsule (150 mg total) by mouth daily with breakfast. For depression.            Follow-up recommendations:  Activities: Resume typical activities Diet: Resume typical diet Tests: none Other: Follow up with outpatient provider and report any side effects to out patient prescriber.  Martha Jordan 08/01/2012, 9:37 AM

## 2012-08-01 NOTE — Progress Notes (Signed)
[facility] Case Management Discharge Plan:  Will you be returning to the same living situation after discharge: Yes,  returning home At discharge, do you have transportation home?:Yes,  family will pick pt up Do you have the ability to pay for your medications:Yes,  access to meds   Release of information consent forms completed and in the chart;  Patient's signature needed at discharge.  Patient to Follow up at:  Follow-up Information    Follow up with [facility] on 08/03/2012. (Appointment scheduled 8:00 am with Brand Males, NP)    Contact information:   [address], [place] [address] [phone]         Patient denies SI/HI:   Yes,  denies SI/HI    Safety Planning and Suicide Prevention discussed:  Yes,  discussed with pt today  Barrier to discharge identified:No.  Summary and Recommendations: Pt attended discharge planning group and actively participated in group.  SW provided pt with today's workbook.  Pt presents with calm mood and affect.  Pt rates depression at a 1 and anxiety at a 3 today.  Pt denies SI/HI.  Pt reports feeling stable to d/c today.  No recommendations from SW.  No further needs voiced by pt.  Pt stable to discharge.     Carmina Miller 08/01/2012, 10:18 AM

## 2012-08-01 NOTE — Progress Notes (Signed)
Pt discharged per MD orders; pt currently denies SI/HI and auditory/visual hallucinations; pt was given education by RN regarding follow-up appointments and medications and pt denied any questions or concerns about these instructions; pt was then escorted to search room to retrieve her belongings by RN before being discharged to hospital lobby. 

## 2012-08-01 NOTE — Tx Team (Signed)
Interdisciplinary Treatment Plan Update (Adult)  Date:  08/01/2012  Time Reviewed:  9:53 AM   Progress in Treatment: Attending groups: Yes Participating in groups:  Yes Taking medication as prescribed: Yes Tolerating medication:  Yes Family/Significant other contact made: Pt refused Patient understands diagnosis:  Yes Discussing patient identified problems/goals with staff:  Yes Medical problems stabilized or resolved:  Yes Denies suicidal/homicidal ideation: Yes Issues/concerns per patient self-inventory:  None identified Other: N/A  New problem(s) identified: None Identified  Reason for Continuation of Hospitalization: Stable to d/c  Interventions implemented related to continuation of hospitalization: Stable to d/c  Additional comments: N/A  Estimated length of stay: D/C today  Discharge Plan: Pt will follow up with [facility] for medication management and therapy.    New goal(s): N/A  Review of initial/current patient goals per problem list:    1.  Goal(s): Reduce depressive symptoms  Met:  Yes  Target date: by discharge  As evidenced by: Reducing depression from a 10 to a 3 as reported by pt.  Pt rates at a 1 today.   2.  Goal (s): Reduce/Eliminate suicidal ideation  Met:  Yes  Target date: by discharge  As evidenced by: pt reporting no SI.    3.  Goal(s): Reduce anxiety symptoms  Met:  Yes  Target date: by discharge  As evidenced by: Reduce anxiety from a 10 to a 3 as reported by pt. Pt rates at a 3 today.     Attendees: Patient:  Martha Jordan 08/01/2012 9:53 AM   Family:     Physician:  Orson Aloe, MD 08/01/2012 9:53 AM   Nursing:   Nestor Ramp, RN 08/01/2012 9:53 AM   Case Manager:  Reyes Ivan, LCSWA 08/01/2012 9:53 AM   Counselor:  Angus Palms, LCSW 08/01/2012 9:53 AM   Other:  Juline Patch, LCSW 08/01/2012 9:53 AM   Other: Berneice Heinrich, RN 08/01/2012 10:17 AM   Other:  Foye Clock, MSW intern 08/01/2012 10:18 AM   Other:      Scribe  for Treatment Team:   Carmina Miller, 08/01/2012 9:53 AM
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
## 2012-08-03 NOTE — Progress Notes (Signed)
Patient Discharge Instructions:  After Visit Summary (AVS):   Faxed to:  08/03/2012 Psychiatric Admission Assessment Note:   Faxed to:  08/03/2012 Suicide Risk Assessment - Discharge Assessment:   Faxed to:  08/03/2012 Faxed/Sent to the Next Level Care provider:  08/03/2012  Faxed to [facility] - Sherwood Gambler, NP @ [phone]  Wandra Scot, 08/03/2012, 2:50 PM

## 2012-09-29 ENCOUNTER — Emergency Department (HOSPITAL_COMMUNITY)
Admission: EM | Admit: 2012-09-29 | Discharge: 2012-09-29 | Disposition: A | Discharge status: Home / Self Care | Payer: Self-pay | Attending: Emergency Medicine | Admitting: Emergency Medicine

## 2012-09-29 ENCOUNTER — Encounter (HOSPITAL_COMMUNITY): Payer: Self-pay | Admitting: Emergency Medicine

## 2012-09-29 ENCOUNTER — Emergency Department (HOSPITAL_COMMUNITY): Payer: Self-pay

## 2012-09-29 DIAGNOSIS — J4489 Other specified chronic obstructive pulmonary disease: Secondary | ICD-10-CM | POA: Insufficient documentation

## 2012-09-29 DIAGNOSIS — M129 Arthropathy, unspecified: Secondary | ICD-10-CM | POA: Insufficient documentation

## 2012-09-29 DIAGNOSIS — F17200 Nicotine dependence, unspecified, uncomplicated: Secondary | ICD-10-CM | POA: Insufficient documentation

## 2012-09-29 DIAGNOSIS — T148XXA Other injury of unspecified body region, initial encounter: Secondary | ICD-10-CM

## 2012-09-29 DIAGNOSIS — Z8659 Personal history of other mental and behavioral disorders: Secondary | ICD-10-CM | POA: Insufficient documentation

## 2012-09-29 DIAGNOSIS — J449 Chronic obstructive pulmonary disease, unspecified: Secondary | ICD-10-CM | POA: Insufficient documentation

## 2012-09-29 DIAGNOSIS — IMO0002 Reserved for concepts with insufficient information to code with codable children: Secondary | ICD-10-CM | POA: Insufficient documentation

## 2012-09-29 DIAGNOSIS — M25529 Pain in unspecified elbow: Secondary | ICD-10-CM | POA: Insufficient documentation

## 2012-09-29 DIAGNOSIS — K219 Gastro-esophageal reflux disease without esophagitis: Secondary | ICD-10-CM | POA: Insufficient documentation

## 2012-09-29 DIAGNOSIS — R51 Headache: Secondary | ICD-10-CM | POA: Insufficient documentation

## 2012-09-29 DIAGNOSIS — M25521 Pain in right elbow: Secondary | ICD-10-CM

## 2012-09-29 DIAGNOSIS — Y929 Unspecified place or not applicable: Secondary | ICD-10-CM | POA: Insufficient documentation

## 2012-09-29 DIAGNOSIS — Z79899 Other long term (current) drug therapy: Secondary | ICD-10-CM | POA: Insufficient documentation

## 2012-09-29 DIAGNOSIS — Z859 Personal history of malignant neoplasm, unspecified: Secondary | ICD-10-CM | POA: Insufficient documentation

## 2012-09-29 DIAGNOSIS — Y939 Activity, unspecified: Secondary | ICD-10-CM | POA: Insufficient documentation

## 2012-09-29 DIAGNOSIS — X58XXXA Exposure to other specified factors, initial encounter: Secondary | ICD-10-CM | POA: Insufficient documentation

## 2012-09-29 MED ORDER — HYDROCODONE-ACETAMINOPHEN 5-325 MG PO TABS: ORAL_TABLET | ORAL | Status: DC

## 2012-09-29 MED ORDER — HYDROCODONE-ACETAMINOPHEN 10-325 MG PO TABS: 2.0000 | ORAL_TABLET | Freq: Once | ORAL | Status: DC

## 2012-09-29 MED ORDER — HYDROCODONE-ACETAMINOPHEN 5-325 MG PO TABS
2.0000 | ORAL_TABLET | Freq: Once | ORAL | Status: AC
  Administered 2012-09-29: 2 via ORAL
  Filled 2012-09-29: qty 2

## 2012-09-29 NOTE — ED Provider Notes (Cosign Needed)
History   This chart was scribed for Ward Givens, MD, by Frederik Pear. The patient was seen in room TR06C/TR06C and the patient's care was started at 1540.    CSN: [id number]  Arrival date & time 09/29/12  1413   First MD Initiated Contact with Patient 09/29/12 1502      Chief Complaint  Patient presents with  . Arm Pain    (Consider location/radiation/quality/duration/timing/severity/associated sxs/prior treatment) HPI Martha Jordan is a 46 y.o. female who presents to the Emergency Department complaining of moderate, gradually worsening right arm pain with associated itching and swelling that began over the past few weeks. The pt had screws placed in her arm by Dr. Lenox Ahr in [place] during a procedure on Oct. 9, 2011. The pt states she has a follow up visit in 4 days to have one of the screws removed. She denies any associated fevers or chills. She is also a pt at [employer]. She quite smoking as of Aug. 22 and does not drink alcohol. She is currently not working.  Patient had fracture of her right forearm and elbow repaired by Dr Lenox Ahr at [facility] in Oct  2011 and  he is going to remove some screws and relocate her radial head soon. She has an pre-op appointment to see him in 4 days. She reports increased swelling and pain over the past few weeks in an area on his elbow. She is concerned that there may be an infection because there is itching around the area. She denies fever, drainage, or chills.   PCP none [facility]   Past Medical History  Diagnosis Date  . Anxiety   . Mental disorder   . Depression   . COPD (chronic obstructive pulmonary disease)   . Shortness of breath   . GERD (gastroesophageal reflux disease)   . Headache   . Cancer   . Arthritis     History reviewed. No pertinent past surgical history.  No family history on file.  History  Substance Use Topics  . Smoking status: Current Every Day Smoker  . Smokeless tobacco: Not on file  . Alcohol Use:  No  unemployed  OB History    Grav Para Term Preterm Abortions TAB SAB Ect Mult Living                  Review of Systems  Musculoskeletal:       Arm pain.  All other systems reviewed and are negative.    Allergies  Sulfa antibiotics  Home Medications   Current Outpatient Rx  Name Route Sig Dispense Refill  . ARTHRITIS STRENGTH BC POWDER PO Oral Take 1 packet by mouth 2 (two) times daily as needed. For pain    . BENADRYL PO Oral Take 1 capsule by mouth daily as needed. For allergies    . FLUTICASONE PROPIONATE 50 MCG/ACT NA SUSP Nasal Place 2 sprays into the nose daily as needed. For allergies    . GABAPENTIN 100 MG PO CAPS Oral Take 100 mg by mouth 3 (three) times daily as needed. For nerve pain    . IBUPROFEN 200 MG PO TABS Oral Take 200 mg by mouth every 6 (six) hours as needed. For pain    . MELOXICAM 15 MG PO TABS Oral Take 15 mg by mouth at bedtime. For inflammation    . PANTOPRAZOLE SODIUM 20 MG PO TBEC Oral Take 20 mg by mouth daily as needed. For control of stomach acid secretion and helps GERD. Keeps  Mobic from hurting the stomach.    . TRAZODONE HCL 50 MG PO TABS Oral Take 50 mg by mouth at bedtime. For sleep    . VENLAFAXINE HCL ER 150 MG PO CP24 Oral Take 150 mg by mouth daily with breakfast. For depression.    . ALBUTEROL SULFATE HFA 108 (90 BASE) MCG/ACT IN AERS Inhalation Inhale 2 puffs into the lungs every 6 (six) hours as needed. For shortess of breath    . TRAZODONE HCL 50 MG PO TABS Oral Take 1 tablet (50 mg total) by mouth at bedtime. For insomnia. 30 tablet 0    BP 153/81  Pulse 87  Temp 98 F (36.7 C) (Oral)  Resp 16  SpO2 100%  Vital signs normal    Physical Exam  Nursing note and vitals reviewed. Constitutional: She is oriented to person, place, and time. She appears well-developed and well-nourished.  Non-toxic appearance. She does not appear ill. No distress.  HENT:  Head: Normocephalic and atraumatic.  Right Ear: External ear normal.    Left Ear: External ear normal.  Nose: Nose normal. No mucosal edema or rhinorrhea.  Mouth/Throat: Oropharynx is clear and moist and mucous membranes are normal. No dental abscesses or uvula swelling.  Eyes: Conjunctivae normal and EOM are normal. Pupils are equal, round, and reactive to light.  Neck: Normal range of motion and full passive range of motion without pain. Neck supple.  Cardiovascular: Intact distal pulses.   Pulmonary/Chest: Effort normal. No respiratory distress. She has no rhonchi. She exhibits no crepitus.  Abdominal: Soft. Normal appearance and bowel sounds are normal. She exhibits no distension. There is no tenderness. There is no rebound and no guarding.  Musculoskeletal: Normal range of motion. She exhibits no edema and no tenderness.       Right upper extremeity no effusion in the elbow There is a well-healed surgival scar over her lateral epicondyle and a well-healed scar over her forearm. There is obvious swelling in the proximal scar around the elbow. It feel like there is a small cyst with a foreign body palpable.     Neurological: She is alert and oriented to person, place, and time. She has normal strength. No cranial nerve deficit.  Skin: Skin is warm, dry and intact. No rash noted. No erythema. No pallor.  Psychiatric: She has a normal mood and affect. Her speech is normal and behavior is normal. Her mood appears not anxious.    ED Course  Procedures (including critical care time)   Medications  HYDROcodone-acetaminophen (NORCO/VICODIN) 5-325 MG per tablet 2 tablet (2 tablet Oral Given 09/29/12 1616)     DIAGNOSTIC STUDIES: Oxygen Saturation is 100% on room air, normal by my interpretation.    COORDINATION OF CARE:  15:40- Discussed planned course of treatment with the patient, including X-rays, who is agreeable at this time.  Review of her x-ray shows there is a free-floating screw in the soft tissue of her right elbow that is just underneath the  surface of her skin.   Dg Elbow Complete Right  09/29/2012  *RADIOLOGY REPORT*  Clinical Data: 46 year old female with prior surgery on the elbow. A surgical screw feels like it is coming to the surface.  Decreased range of motion.  RIGHT ELBOW - COMPLETE 3+ VIEW  Comparison: None.  Findings: Previous ORIF of the proximal ulna.  Nonunited proximal ulnar shaft fracture.  Screws and plate in the proximal ulna appear intact.  Two cortical screws or cortical screw fragment along the lateral distal  humerus are present.  None of these appears to be partially within bone, the second is more superficial and the superficial aspect of the hardware is within 2 mm of the skin surface.  Mild surrounding stranding.  No definite joint effusion.  Visualized right radius appears intact.  No acute fracture identified.  IMPRESSION: 1.  There is metal hardware along the distal lateral right humerus located 2 mm below the skin surface. 2.  Previous right ulna ORIF with ununited shaft fracture. 3.  No acute fracture dislocation identified.   Original Report Authenticated By: Ulla Potash III, M.D.      1. Elbow pain, right   2. Foreign Body In Subcutaneous Tissue    New Prescriptions   HYDROCODONE-ACETAMINOPHEN (NORCO/VICODIN) 5-325 MG PER TABLET    Take 1 or 2 po Q 6hrs for pain    Plan discharge  Devoria Albe, MD, FACEP    MDM   I personally performed the services described in this documentation, which was scribed in my presence. The recorded information has been reviewed and considered.  Devoria Albe, MD, Armando Gang        Ward Givens, MD 09/29/12 [phone]

## 2012-09-29 NOTE — ED Notes (Signed)
Pt. Stated, the screw in my arm at the elbow is coming out and my appt. Is Wed in [place] with Dr. Manson Passey.  At the elbow site swelling , itching and pain.

## 2013-08-13 ENCOUNTER — Emergency Department (HOSPITAL_COMMUNITY): Payer: Self-pay

## 2013-08-13 ENCOUNTER — Encounter (HOSPITAL_COMMUNITY): Payer: Self-pay | Admitting: Emergency Medicine

## 2013-08-13 ENCOUNTER — Emergency Department (HOSPITAL_COMMUNITY)
Admission: EM | Admit: 2013-08-13 | Discharge: 2013-08-13 | Disposition: A | Discharge status: Home / Self Care | Payer: Self-pay | Attending: Emergency Medicine | Admitting: Emergency Medicine

## 2013-08-13 DIAGNOSIS — Z87828 Personal history of other (healed) physical injury and trauma: Secondary | ICD-10-CM | POA: Insufficient documentation

## 2013-08-13 DIAGNOSIS — M79601 Pain in right arm: Secondary | ICD-10-CM

## 2013-08-13 DIAGNOSIS — M25539 Pain in unspecified wrist: Secondary | ICD-10-CM | POA: Insufficient documentation

## 2013-08-13 DIAGNOSIS — Z79899 Other long term (current) drug therapy: Secondary | ICD-10-CM | POA: Insufficient documentation

## 2013-08-13 DIAGNOSIS — F17200 Nicotine dependence, unspecified, uncomplicated: Secondary | ICD-10-CM | POA: Insufficient documentation

## 2013-08-13 DIAGNOSIS — M129 Arthropathy, unspecified: Secondary | ICD-10-CM | POA: Insufficient documentation

## 2013-08-13 DIAGNOSIS — J449 Chronic obstructive pulmonary disease, unspecified: Secondary | ICD-10-CM | POA: Insufficient documentation

## 2013-08-13 DIAGNOSIS — Z760 Encounter for issue of repeat prescription: Secondary | ICD-10-CM

## 2013-08-13 DIAGNOSIS — F411 Generalized anxiety disorder: Secondary | ICD-10-CM | POA: Insufficient documentation

## 2013-08-13 DIAGNOSIS — F3289 Other specified depressive episodes: Secondary | ICD-10-CM | POA: Insufficient documentation

## 2013-08-13 DIAGNOSIS — Z859 Personal history of malignant neoplasm, unspecified: Secondary | ICD-10-CM | POA: Insufficient documentation

## 2013-08-13 DIAGNOSIS — K219 Gastro-esophageal reflux disease without esophagitis: Secondary | ICD-10-CM | POA: Insufficient documentation

## 2013-08-13 DIAGNOSIS — J4489 Other specified chronic obstructive pulmonary disease: Secondary | ICD-10-CM | POA: Insufficient documentation

## 2013-08-13 DIAGNOSIS — G8918 Other acute postprocedural pain: Secondary | ICD-10-CM | POA: Insufficient documentation

## 2013-08-13 DIAGNOSIS — F329 Major depressive disorder, single episode, unspecified: Secondary | ICD-10-CM | POA: Insufficient documentation

## 2013-08-13 MED ORDER — OXYCODONE-ACETAMINOPHEN 5-325 MG PO TABS: 1.0000 | ORAL_TABLET | Freq: Four times a day (QID) | ORAL | Status: AC | PRN

## 2013-08-13 MED ORDER — VENLAFAXINE HCL ER 150 MG PO CP24: 150.0000 mg | ORAL_CAPSULE | Freq: Every day | ORAL | Status: AC

## 2013-08-13 NOTE — Progress Notes (Signed)
[facility] provided pt with a list of primary care resources and a [facility] [employer] application. Patient stated that she was pending Medicaid.  °

## 2013-08-13 NOTE — ED Provider Notes (Signed)
CSN: [id number]     Arrival date & time 08/13/13  1255 History   First MD Initiated Contact with Patient 08/13/13 1339     Chief Complaint  Patient presents with  . Arm Pain    hx of surgery r/arm. Currently c/o pain at surgical site   (Consider location/radiation/quality/duration/timing/severity/associated sxs/prior Treatment) HPI  Glendi Mohiuddin is a 47 y.o.female with multiple medical problems presents to the ER with complaints of increased pain in her right forearm. She has a history of severe injury after fall in 2011 requiring repair in [place]. Approx 1 year ago a screw came loose and she had to have it removed. 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 She is right handed. For the past 2 weeks her arm has become increasingly more and more painful to where it is so severe she doesn't want to move it much. She denies having rash or redness over the area. She has not have any fevers, nausea, vomiting, diarrhea, weakness, recurrent injury.    Past Medical History  Diagnosis Date  . Anxiety   . Mental disorder   . Depression   . COPD (chronic obstructive pulmonary disease)   . Shortness of breath   . GERD (gastroesophageal reflux disease)   . Headache(784.0)   . Cancer   . Arthritis    Past Surgical History  Procedure Laterality Date  . Arm surgery    . Abdominal hysterectomy     Family History  Problem Relation Age of Onset  . Hypertension Mother   . Diabetes Mother   . Cancer Mother   . Hypertension Father   . Stroke Father   . Cancer Father    History  Substance Use Topics  . Smoking status: Current Every Day Smoker    Types: Cigarettes  . Smokeless tobacco: Not on file  . Alcohol Use: Yes   OB History   Grav Para Term Preterm Abortions TAB SAB Ect Mult Living                 Review of Systems ROS is negative unless otherwise stated in the HPI  Allergies  Sulfa antibiotics  Home Medications    Current Outpatient Rx  Name  Route  Sig  Dispense  Refill  . albuterol (PROVENTIL HFA;VENTOLIN HFA) 108 (90 BASE) MCG/ACT inhaler   Inhalation   Inhale 2 puffs into the lungs every 6 (six) hours as needed. For shortess of breath         . Aspirin-Salicylamide-Caffeine (ARTHRITIS STRENGTH BC POWDER PO)   Oral   Take 1 packet by mouth 2 (two) times daily as needed. For pain         . fluticasone (FLONASE) 50 MCG/ACT nasal spray   Nasal   Place 2 sprays into the nose daily as needed. For allergies         . gabapentin (NEURONTIN) 100 MG capsule   Oral   Take 100 mg by mouth 3 (three) times daily as needed. For nerve pain         . ibuprofen (ADVIL,MOTRIN) 200 MG tablet   Oral   Take 600 mg by mouth every 6 (six) hours as needed. For pain         . oxyCODONE-acetaminophen (PERCOCET/ROXICET) 5-325 MG per tablet   Oral   Take 1 tablet by mouth every 6 (six) hours as needed for pain.   20 tablet  0   . venlafaxine XR (EFFEXOR-XR) 150 MG 24 hr capsule   Oral   Take 1 capsule (150 mg total) by mouth daily with breakfast. For depression.   30 capsule   0    BP 134/73  Pulse 73  Temp(Src) 98.3 F (36.8 C) (Oral)  Resp 18  Wt 115 lb (52.164 kg)  BMI 21.74 kg/m2  SpO2 100% Physical Exam  Nursing note and vitals reviewed. Constitutional: She appears well-developed and well-nourished. No distress.  HENT:  Head: Normocephalic and atraumatic.  Eyes: Pupils are equal, round, and reactive to light.  Neck: Normal range of motion. Neck supple.  Cardiovascular: Normal rate and regular rhythm.   Pulmonary/Chest: Effort normal.  Abdominal: Soft.  Musculoskeletal:       Left forearm: She exhibits tenderness and bony tenderness. She exhibits no swelling, no edema, no deformity and no laceration.  Neurological: She is alert.  Skin: Skin is warm and dry.    ED Course  Procedures (including critical care time) Labs Review Labs Reviewed - No data to display Imaging  Review Dg Elbow Complete Right  08/13/2013   *RADIOLOGY REPORT*  Clinical Data: Increasing discomfort in forearm and elbow since beginning a new job in [employer], history of prior ORIF proximal right ulna  RIGHT ELBOW - COMPLETE 3+ VIEW  Comparison: 09/29/2012  Findings: Single screw anchor at the lateral humeral epicondyle with interval removal of a second anchor. Bones appear demineralized. Mild radiocapitellar degenerative changes. Malleable plate with multiple screws identified at the proximal to mid right ulnar diaphysis post ORIF of a proximal ulnar diaphyseal fracture. 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 Lucency surrounding the proximal three screws at the ulnar plate worrisome for loosening or infection. No acute fracture, dislocation or bone destruction. No definite elbow joint effusion.  IMPRESSION: New lucency surrounding the proximal three screws at the ulnar diaphyseal plate suspicious for loosening or infection. Radiocapitellar degenerative changes.   Original Report Authenticated By: Ulyses Southward, M.D.   Dg Forearm Right  08/13/2013   *RADIOLOGY REPORT*  Clinical Data: Prior ORIF right ulna, increasing discomfort in forearm and elbow since beginning new job in [employer]  RIGHT FOREARM - 2 VIEW  Comparison: Right elbow radiographs 09/29/2012  Findings: Mild plate and seven screws identified at the proximal and mid right ulnar diaphysis post ORIF of a proximal ulnar diaphyseal fracture. Fracture plane remains evident consistent with nonunion. Mild apex radial angulation at the fracture is identified. Additionally, mild lucency is seen surrounding the proximal 3 ulnar screws raising question of loosening or infection, new since prior study. Radiocapitellar degenerative changes present. Bones appear slightly demineralized. Metallic screw/anchor present at the lateral humeral  epicondyle, with interval removal of a second anchor. No acute fracture or dislocation.  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 Lucency is identified surrounding the proximal three screws raising question of loosening or infection. Osseous demineralization with mild radiocapitellar degenerative changes.   Original Report Authenticated By: Ulyses Southward, M.D.    MDM   1. Arm pain, right   2. Medication refill     xrays show possible infection vs loosening of screw. Pt does not have any s/sx consistent with infection. Dr. Juleen China saw pt as well for a second opinion and agrees that infection is highly unlikely. Screws appear to be out of place on xray. Pt given strict return  to ER precautions.   Rx: Percocet and refill of Effexor.  47 y.o.Jaiah Osland's evaluation in the Emergency Department is complete. It has been determined that no acute conditions requiring further emergency intervention are present at this time. The patient/guardian have been advised of the diagnosis and plan. We have discussed signs and symptoms that warrant return to the ED, such as changes or worsening in symptoms.  Vital signs are stable at discharge. Filed Vitals:   08/13/13 1325  BP: 134/73  Pulse: 73  Temp: 98.3 F (36.8 C)  Resp: 18    Patient/guardian has voiced understanding and agreed to follow-up with the PCP or specialist.      Dorthula Matas, PA-C 08/13/13 1507

## 2013-08-13 NOTE — ED Provider Notes (Signed)
Medical screening examination/treatment/procedure(s) were conducted as a shared visit with non-physician practitioner(s) and myself.  I personally evaluated the patient during the encounter.  47yf with R elbow pain. Hx of trauma s/p ORIF 2011.  Denies interim trauma. Imaging today with hardware loosening versus infection. Clinically no signs of infection. 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 Will treat symptoms at this point. No indication for antibiotics. Orthopedic followup. Return precautions discussed.  Raeford Razor, MD 08/13/13 [phone]

## 2013-08-13 NOTE — ED Notes (Signed)
Bed: WTR8 Expected date:  Expected time:  Means of arrival:  Comments: [facility]

## 2013-08-13 NOTE — ED Notes (Signed)
Pt reports increased pain x 2 weeks in r/forearm. Hx of 2011 "rods" in r/arm.

## 2013-08-14 NOTE — ED Provider Notes (Signed)
Medical screening examination/treatment/procedure(s) were conducted as a shared visit with non-physician practitioner(s) and myself.  I personally evaluated the patient during the encounter  Raeford Razor, MD 08/14/13 [phone]

## 2015-01-19 ENCOUNTER — Emergency Department (HOSPITAL_COMMUNITY): Payer: Self-pay

## 2015-01-19 ENCOUNTER — Emergency Department (HOSPITAL_COMMUNITY)
Admission: EM | Admit: 2015-01-19 | Discharge: 2015-01-19 | Disposition: A | Discharge status: Home / Self Care | Payer: Self-pay | Attending: Emergency Medicine | Admitting: Emergency Medicine

## 2015-01-19 ENCOUNTER — Encounter (HOSPITAL_COMMUNITY): Payer: Self-pay

## 2015-01-19 DIAGNOSIS — Z859 Personal history of malignant neoplasm, unspecified: Secondary | ICD-10-CM | POA: Insufficient documentation

## 2015-01-19 DIAGNOSIS — Z79899 Other long term (current) drug therapy: Secondary | ICD-10-CM | POA: Insufficient documentation

## 2015-01-19 DIAGNOSIS — Y9289 Other specified places as the place of occurrence of the external cause: Secondary | ICD-10-CM | POA: Insufficient documentation

## 2015-01-19 DIAGNOSIS — M1990 Unspecified osteoarthritis, unspecified site: Secondary | ICD-10-CM | POA: Insufficient documentation

## 2015-01-19 DIAGNOSIS — S2231XA Fracture of one rib, right side, initial encounter for closed fracture: Secondary | ICD-10-CM | POA: Insufficient documentation

## 2015-01-19 DIAGNOSIS — Z8719 Personal history of other diseases of the digestive system: Secondary | ICD-10-CM | POA: Insufficient documentation

## 2015-01-19 DIAGNOSIS — Y998 Other external cause status: Secondary | ICD-10-CM | POA: Insufficient documentation

## 2015-01-19 DIAGNOSIS — Z8659 Personal history of other mental and behavioral disorders: Secondary | ICD-10-CM | POA: Insufficient documentation

## 2015-01-19 DIAGNOSIS — Y9389 Activity, other specified: Secondary | ICD-10-CM | POA: Insufficient documentation

## 2015-01-19 DIAGNOSIS — J441 Chronic obstructive pulmonary disease with (acute) exacerbation: Secondary | ICD-10-CM | POA: Insufficient documentation

## 2015-01-19 DIAGNOSIS — Z720 Tobacco use: Secondary | ICD-10-CM | POA: Insufficient documentation

## 2015-01-19 DIAGNOSIS — W1839XA Other fall on same level, initial encounter: Secondary | ICD-10-CM | POA: Insufficient documentation

## 2015-01-19 MED ORDER — CYCLOBENZAPRINE HCL 10 MG PO TABS
10.0000 mg | ORAL_TABLET | Freq: Once | ORAL | Status: AC
  Administered 2015-01-19: 10 mg via ORAL
  Filled 2015-01-19: qty 1

## 2015-01-19 MED ORDER — OXYCODONE-ACETAMINOPHEN 5-325 MG PO TABS: 1.0000 | ORAL_TABLET | Freq: Four times a day (QID) | ORAL | Status: AC | PRN

## 2015-01-19 MED ORDER — CYCLOBENZAPRINE HCL 10 MG PO TABS: 10.0000 mg | ORAL_TABLET | Freq: Two times a day (BID) | ORAL | Status: AC | PRN

## 2015-01-19 MED ORDER — OXYCODONE-ACETAMINOPHEN 5-325 MG PO TABS
1.0000 | ORAL_TABLET | Freq: Four times a day (QID) | ORAL | Status: DC | PRN
  Administered 2015-01-19: 1 via ORAL
  Filled 2015-01-19: qty 1

## 2015-01-19 NOTE — ED Provider Notes (Signed)
CSN: [id number]     Arrival date & time 01/19/15  1321 History   First MD Initiated Contact with Patient 01/19/15 1414     Chief Complaint  Patient presents with  . Fall     (Consider location/radiation/quality/duration/timing/severity/associated sxs/prior Treatment) Patient is a 49 y.o. female presenting with fall. The history is provided by the patient.  Fall This is a new problem. The current episode started 2 days ago. Episode frequency: once. The problem has not changed since onset.Associated symptoms include chest pain and shortness of breath. Pertinent negatives include no abdominal pain. Exacerbated by: breathing, coughing. Nothing relieves the symptoms. She has tried nothing for the symptoms.    Past Medical History  Diagnosis Date  . Anxiety   . Mental disorder   . Depression   . COPD (chronic obstructive pulmonary disease)   . Shortness of breath   . GERD (gastroesophageal reflux disease)   . Headache(784.0)   . Cancer   . Arthritis    Past Surgical History  Procedure Laterality Date  . Arm surgery    . Abdominal hysterectomy     Family History  Problem Relation Age of Onset  . Hypertension Mother   . Diabetes Mother   . Cancer Mother   . Hypertension Father   . Stroke Father   . Cancer Father    History  Substance Use Topics  . Smoking status: Current Every Day Smoker    Types: Cigarettes  . Smokeless tobacco: Not on file  . Alcohol Use: Yes   OB History    No data available     Review of Systems  Constitutional: Negative for fever and chills.  Respiratory: Positive for cough and shortness of breath.   Cardiovascular: Positive for chest pain.  Gastrointestinal: Negative for vomiting and abdominal pain.  All other systems reviewed and are negative.     Allergies  Sulfa antibiotics  Home Medications   Prior to Admission medications   Medication Sig Start Date End Date Taking? Authorizing Provider  albuterol (PROVENTIL HFA;VENTOLIN HFA)  108 (90 BASE) MCG/ACT inhaler Inhale 2 puffs into the lungs every 6 (six) hours as needed. For shortess of breath    Historical Provider, MD  Aspirin-Salicylamide-Caffeine (ARTHRITIS STRENGTH BC POWDER PO) Take 1 packet by mouth 2 (two) times daily as needed. For pain    Historical Provider, MD  cyclobenzaprine (FLEXERIL) 10 MG tablet Take 1 tablet (10 mg total) by mouth 2 (two) times daily as needed for muscle spasms. 01/19/15   Evelina Bucy, MD  fluticasone [facility]) 50 MCG/ACT nasal spray Place 2 sprays into the nose daily as needed. For allergies 08/01/12   Darrol Jump, MD  gabapentin (NEURONTIN) 100 MG capsule Take 100 mg by mouth 3 (three) times daily as needed. For nerve pain 08/01/12 08/13/13  Darrol Jump, MD  ibuprofen (ADVIL,MOTRIN) 200 MG tablet Take 600 mg by mouth every 6 (six) hours as needed. For pain    Historical Provider, MD  oxyCODONE-acetaminophen (PERCOCET) 5-325 MG per tablet Take 1 tablet by mouth every 6 (six) hours as needed for moderate pain. 01/19/15   Evelina Bucy, MD  oxyCODONE-acetaminophen (PERCOCET/ROXICET) 5-325 MG per tablet Take 1 tablet by mouth every 6 (six) hours as needed for pain. 08/13/13   Tiffany Marilu Favre, PA-C  venlafaxine XR (EFFEXOR-XR) 150 MG 24 hr capsule Take 1 capsule (150 mg total) by mouth daily with breakfast. For depression. 08/13/13 08/25/14  Tiffany Marilu Favre, PA-C   BP 143/73 mmHg  Pulse 86  Temp(Src) 98.1 F (36.7 C) (Oral)  Resp 18  SpO2 100% Physical Exam  Constitutional: She is oriented to person, place, and time. She appears well-developed and well-nourished. No distress.  HENT:  Head: Normocephalic and atraumatic.  Mouth/Throat: Oropharynx is clear and moist.  Eyes: EOM are normal. Pupils are equal, round, and reactive to light.  Neck: Normal range of motion. Neck supple.  Cardiovascular: Normal rate and regular rhythm.  Exam reveals no friction rub.   No murmur heard. Pulmonary/Chest: Effort normal and breath sounds normal. No  respiratory distress. She has no wheezes. She has no rales. She exhibits tenderness (R lower anterior ribs).  Abdominal: Soft. She exhibits no distension. There is no tenderness. There is no rebound.  Musculoskeletal: Normal range of motion. She exhibits no edema.  Neurological: She is alert and oriented to person, place, and time.  Skin: She is not diaphoretic.  Nursing note and vitals reviewed.   ED Course  Procedures (including critical care time) Labs Review Labs Reviewed - No data to display  Imaging Review Dg Ribs Unilateral W/chest Right  01/19/2015   CLINICAL DATA:  Pain following a fall  EXAM: RIGHT RIBS AND CHEST - 3+ VIEW  COMPARISON:  None.  FINDINGS: Frontal chest as well as oblique and cone-down lower rib images were obtained. There is no edema or consolidation. Heart size and pulmonary vascularity are normal. No adenopathy. There is no apparent pneumothorax or effusion. There are mildly displaced anterior right ninth and tenth rib fractures.  IMPRESSION: Mildly displaced anterior right ninth and tenth rib fractures. No apparent pneumothorax. Lungs clear.   Electronically Signed   By: Lowella Grip III M.D.   On: 01/19/2015 14:39   Dg Thoracic Spine 2 View  01/19/2015   CLINICAL DATA:  Status post fall.  Back pain.  EXAM: THORACIC SPINE - 2 VIEW  COMPARISON:  None.  FINDINGS: There is no evidence of thoracic spine fracture. Alignment is normal. No other significant bone abnormalities are identified.  IMPRESSION: Negative.   Electronically Signed   By: Kathreen Devoid   On: 01/19/2015 14:42   Dg Lumbar Spine Complete  01/19/2015   CLINICAL DATA:  Status post fall.  Back pain.  EXAM: LUMBAR SPINE - COMPLETE 4+ VIEW  COMPARISON:  None.  FINDINGS: There is no evidence of lumbar spine fracture. Alignment is normal. Intervertebral disc spaces are maintained.  There is abdominal aortic atherosclerosis.  IMPRESSION: No acute osseous injury of the lumbar spine.   Electronically Signed    By: Kathreen Devoid   On: 01/19/2015 14:43     EKG Interpretation None      MDM   Final diagnoses:  Closed fracture of rib of right side, initial encounter    75F here with R sided chest pain. Fell on concrete steps 2 days ago while walking the dog. Now having some chest pain, pain with coughing and breathing. No head injury, no loss of consciousness. No extremity injury. Xrays ordered, show R sided anterior rib fractures. Lungs clear on exam, no crepitus appreciated. Films reviewed by me. Will give percocet and flexeril. Given resource guide for f/u.    Evelina Bucy, MD 01/19/15 [phone]

## 2015-01-19 NOTE — ED Notes (Signed)
Pt reports falling down 5 steps two nights ago.  Now is having pain to her right ribcage area and back.  Pt sts it hurts to take a deep breath. Pain 9/10.

## 2015-01-19 NOTE — ED Notes (Signed)
Pt back in room from xray 

## 2015-01-19 NOTE — Discharge Instructions (Signed)
Rib Fracture °A rib fracture is a break or crack in one of the bones of the ribs. The ribs are a group of long, curved bones that wrap around your chest and attach to your spine. They protect your lungs and other organs in the chest cavity. A broken or cracked rib is often painful, but most do not cause other problems. Most rib fractures heal on their own over time. However, rib fractures can be more serious if multiple ribs are broken or if broken ribs move out of place and push against other structures. °CAUSES  °· A direct blow to the chest. For example, this could happen during contact sports, a car accident, or a fall against a hard object. °· Repetitive movements with high force, such as pitching a baseball or having severe coughing spells. °SYMPTOMS  °· Pain when you breathe in or cough. °· Pain when someone presses on the injured area. °DIAGNOSIS  °Your caregiver will perform a physical exam. Various imaging tests may be ordered to confirm the diagnosis and to look for related injuries. These tests may include a chest X-ray, computed tomography (CT), magnetic resonance imaging (MRI), or a bone scan. °TREATMENT  °Rib fractures usually heal on their own in 1-3 months. The longer healing period is often associated with a continued cough or other aggravating activities. During the healing period, pain control is very important. Medication is usually given to control pain. Hospitalization or surgery may be needed for more severe injuries, such as those in which multiple ribs are broken or the ribs have moved out of place.  °HOME CARE INSTRUCTIONS  °· Avoid strenuous activity and any activities or movements that cause pain. Be careful during activities and avoid bumping the injured rib. °· Gradually increase activity as directed by your caregiver. °· Only take over-the-counter or prescription medications as directed by your caregiver. Do not take other medications without asking your caregiver first. °· Apply ice  to the injured area for the first 1-2 days after you have been treated or as directed by your caregiver. Applying ice helps to reduce inflammation and pain. °¨ Put ice in a plastic bag. °¨ Place a towel between your skin and the bag.   °¨ Leave the ice on for 15-20 minutes at a time, every 2 hours while you are awake. °· Perform deep breathing as directed by your caregiver. This will help prevent pneumonia, which is a common complication of a broken rib. Your caregiver may instruct you to: °¨ Take deep breaths several times a day. °¨ Try to cough several times a day, holding a pillow against the injured area. °¨ Use a device called an incentive spirometer to practice deep breathing several times a day. °· Drink enough fluids to keep your urine clear or pale yellow. This will help you avoid constipation.   °· Do not wear a rib belt or binder. These restrict breathing, which can lead to pneumonia.   °SEEK IMMEDIATE MEDICAL CARE IF:  °· You have a fever.   °· You have difficulty breathing or shortness of breath.   °· You develop a continual cough, or you cough up thick or bloody sputum. °· You feel sick to your stomach (nausea), throw up (vomit), or have abdominal pain.   °· You have worsening pain not controlled with medications.   °MAKE SURE YOU: °· Understand these instructions. °· Will watch your condition. °· Will get help right away if you are not doing well or get worse. °Document Released: 11/21/2005 Document Revised: 07/24/2013 Document Reviewed:   01/23/2013 ExitCare Patient Information 2015 [place], [place]. This information is not intended to replace advice given to you by your health care provider. Make sure you discuss any questions you have with your health care provider.   Emergency Department Resource Guide 1) Find a Doctor and Pay Out of Pocket Although you won't have to find out who is covered by your insurance plan, it is a good idea to ask around and get recommendations. You will then need to  call the office and see if the doctor you have chosen will accept you as a new patient and what types of options they offer for patients who are self-pay. Some doctors offer discounts or will set up payment plans for their patients who do not have insurance, but you will need to ask so you aren't surprised when you get to your appointment.  2) Contact Your Local Health Department Not all health departments have doctors that can see patients for sick visits, but many do, so it is worth a call to see if yours does. If you don't know where your local health department is, you can check in your phone book. The CDC also has a tool to help you locate your state's health department, and many state websites also have listings of all of their local health departments.  3) Find a [facility] If your illness is not likely to be very severe or complicated, you may want to try a walk in clinic. These are popping up all over the country in pharmacies, drugstores, and shopping centers. They're usually staffed by nurse practitioners or physician assistants that have been trained to treat common illnesses and complaints. They're usually fairly quick and inexpensive. However, if you have serious medical issues or chronic medical problems, these are probably not your best option.  No Primary Care Doctor: - Call Health Connect at  [phone] - they can help you locate a primary care doctor that  accepts your insurance, provides certain services, etc. - Physician Referral Service- [phone]  Chronic Pain Problems: Organization         Address  Phone   Notes  [facility]  [phone] Patients need to be referred by their primary care doctor.   Medication Assistance: Organization         Address  Phone   Notes  [facility] Medication [facility] [place] [address] [place], [place] [address] [phone] --Must be a resident of [facility] -- Must have NO insurance  coverage whatsoever (no Medicaid/ Medicare, etc.) -- The pt. MUST have a primary care doctor that directs their care regularly and follows them in the community   [facility]  [phone]   [employer]  [phone]    Agencies that provide inexpensive medical care: Organization         Address  Phone   Notes  [place]  [phone]   [facility]    [phone]   [facility] [place], [place] [address] [phone]   [place] [address] [place] [phone]   Planned Parenthood    [phone]   [facility]    [phone]   [place] and [place] [address], [place] Phone:  [phone], Fax:  [phone] Hours of Operation:  9 am - 6 pm, M-F.  Also accepts Medicaid/Medicare and self-pay.  [facility]  [place] for [place] [address], [address], [place] Phone: [phone], Fax: [phone]. Hours of Operation:  8:30 am - 5:30 pm, M-F.  Also accepts Medicaid and self-pay.  [facility] [place] [address] [place] Phone: [phone]   [place], [place], [place] [phone], Ext. [phone] Mondays & Thursdays: 7-9 AM.  First 15 patients are seen on a first come, first serve basis.    [place] Providers:  Organization         Address  Phone   Notes  [facility] [address], [place] [phone] Also accepts self-pay patients.  [facility] [address], [place]  [phone]   [place], [address], [place] [phone]   [facility] [address] [place] [phone]   Lucianne Lei [address], [place]   [phone] Only accepts [place] Access [place] patients after they have their  name applied to their card.   Self-Pay (no insurance) in [facility]:  Organization         Address  Phone   Notes  Sickle Cell Patients, [facility] Internal Medicine [place] [phone]   [facility] [place] [phone]   [facility] [place], [place],  [phone]   [facility]/Dr. Osei-Bonsu  [address], [place] or [address], [place] [phone] Phone number for both [place] and [place] locations is the same.  [facility]gent [facility] and [facility] [address], [place] [phone]   [facility] [address] [place] or [address] [phone]   [facility] [address], [place] [phone], phone; [phone], fax Sees patients 1st and 3rd Saturday of every month.  Must not qualify for public or private insurance (i.e. Medicaid, Medicare, [place] Health Choice, Veterans' Benefits)  Household income should be no more than 200% of the poverty level The clinic cannot treat you if you are pregnant or think you are pregnant  Sexually transmitted diseases are not treated at the clinic.    Dental Care: Organization         Address  Phone  Notes  [facility] Department of [facility] [place] [phone] Accepts children up to age 1 who are enrolled in [place] or [place]; pregnant women with a Medicaid card; and children who have applied for Medicaid or [place] Health Choice, but were declined, whose parents can pay a reduced fee at time of service.  [facility] Department of [facility]  [address], [place] [phone] Accepts children up to age 55 who are enrolled in [place] or [place]; pregnant women with a Medicaid card; and children who have applied for  Medicaid or  Health Choice, but were declined, whose parents can pay a reduced fee at time of service.  [facility] Access PRO[facility]  [place] [phone] Patients are seen by appointment only. Walk-ins are not accepted. [place] will see patients 11 years of age and older. Monday - Tuesday (8am-5pm) Most Wednesdays (8:30-5pm) $30 per visit,  cash only  [employer] Adult [employer] PROGRAM  [address], [place] [phone] Patients are seen by appointment only. Walk-ins are not accepted. [place] will see patients 44 years of age and older. One Wednesday Evening (Monthly: Volunteer Based).  $30 per visit, cash only  [place]  [phone] for adults; Children under age 29, call Graduate Pediatric Dentistry at [phone]. Children aged 91-14, please call [phone] to request a pediatric application.  Dental services are provided in all areas of dental care including fillings, crowns and bridges, complete and partial dentures, implants, gum treatment, root canals, and extractions. Preventive care is also provided. Treatment is provided to both adults and children. Patients are selected via a lottery and there is often a waiting list.   [facility] [address] [place]  [phone] [email or web address]   [facility] [address] [place], [place] [phone], Ext. [phone] Second and Fourth Thursday of each month, opens at 6:30 AM; Clinic ends at 9 AM.  Patients are seen on a first-come first-served basis, and a limited number are seen during each clinic.   [facility]  [address]ard Danker [place], [place] [phone]   Eligibility Requirements You must have lived in [place], [place], or [place] counties for at least the last three months.   You cannot be eligible for state or federal sponsored [employer], including [employer], [place],  or [employer].   You generally cannot be eligible for healthcare insurance through your employer.    How to apply: Eligibility screenings are held every Tuesday and Wednesday afternoon from 1:00 pm until 4:00 pm. You do not need an appointment for the interview!  [facility] [address] [place], [place]   [place]  [place] Department  [place]  [phone]    Behavioral Health Resources in the Community: Intensive Outpatient Programs Organization         Address  Phone  Notes  [place]. [address] [place], [place] [phone]   [facility] Outpatient [address], [place], [place]   ADS: [facility] [address] [place], [place]   [place] [address],  [place], [place] or [phone]   Substance Abuse Resources Organization         Address  Phone  Notes  Al[facility] Drug Services  [phone]   [place]  [phone]   [facility] [place]   Chinita Pester  [phone]   Residential & Outpatient Substance Abuse Program  [phone]   Psychological Services Organization         Address  Phone  Notes  [facility] [place]  [place]  [phone]   [place] [address], [place] or [phone]    Mobile Crisis Teams Organization         Address  Phone  Notes  [facility], Mobile Crisis Care Unit  [phone]   [facility]  [address] [place], [place]   [place] Levels [address] [place] [phone]    Self-Help/Support Groups Organization         Address  Phone  Notes  [facility] [place] - variety of support groups   [place] Call for more information  Narcotics Anonymous (NA), [facility] [address], [employer] [place]  2 meetings at this location   Special educational needs teacher         Address  Phone  Notes  [facility] Residential Treatment [place],    [place]  [phone]   [facility]  [address] [place] [address]021, [place], [place]   [place], [place] [phone] Admissions: 8am-3pm M-F  Incentives Substance [place] [address],    [place], [place] [phone]   [facility] [address] [place], [place], [place]   [facility] [address]  [place], [place]   [facility] - Intensive Outpatient [address]een [address], [place], [place]   [facility] [place].,  [place], [place] [phone] or [phone]   [facility] ([facility]) [address], [place], [place] Accepts Medicaid  [facility] [place] [address]  [place] [phone] Substance Abuse/Addiction Treatment   [facility] Organization         Address  Phone  Notes  [facility]  [phone]   Domenic Schwab, PhD [address] Arlis Porta [place], [place]   [phone] or [phone]   [place], [place] [phone]   [facility] [address] [place], [place] [phone] Insurance/Medicaid/sponsorship through [facility] and Families [address]                                    [place], [place] [phone] [place] [address], [place] [phone]    Dr. Adele Schilder  [phone]   [facility] [place] Dept. 1) [address], [place] 2) [place] 3)  [place] [address], [place] [phone]  [phone]   [place] [phone] or [phone] (After Hours)

## 2015-01-19 NOTE — ED Notes (Signed)
Declined W/C at D/C and was escorted to lobby by RN. 

## 2016-02-29 IMAGING — CR DG THORACIC SPINE 2V
3 series · 3 of 3 positions shown · non-contrast
Comparison: None.

CLINICAL DATA: Status post fall.  Back pain.

EXAM:
THORACIC SPINE - 2 VIEW

[t-spine ap]
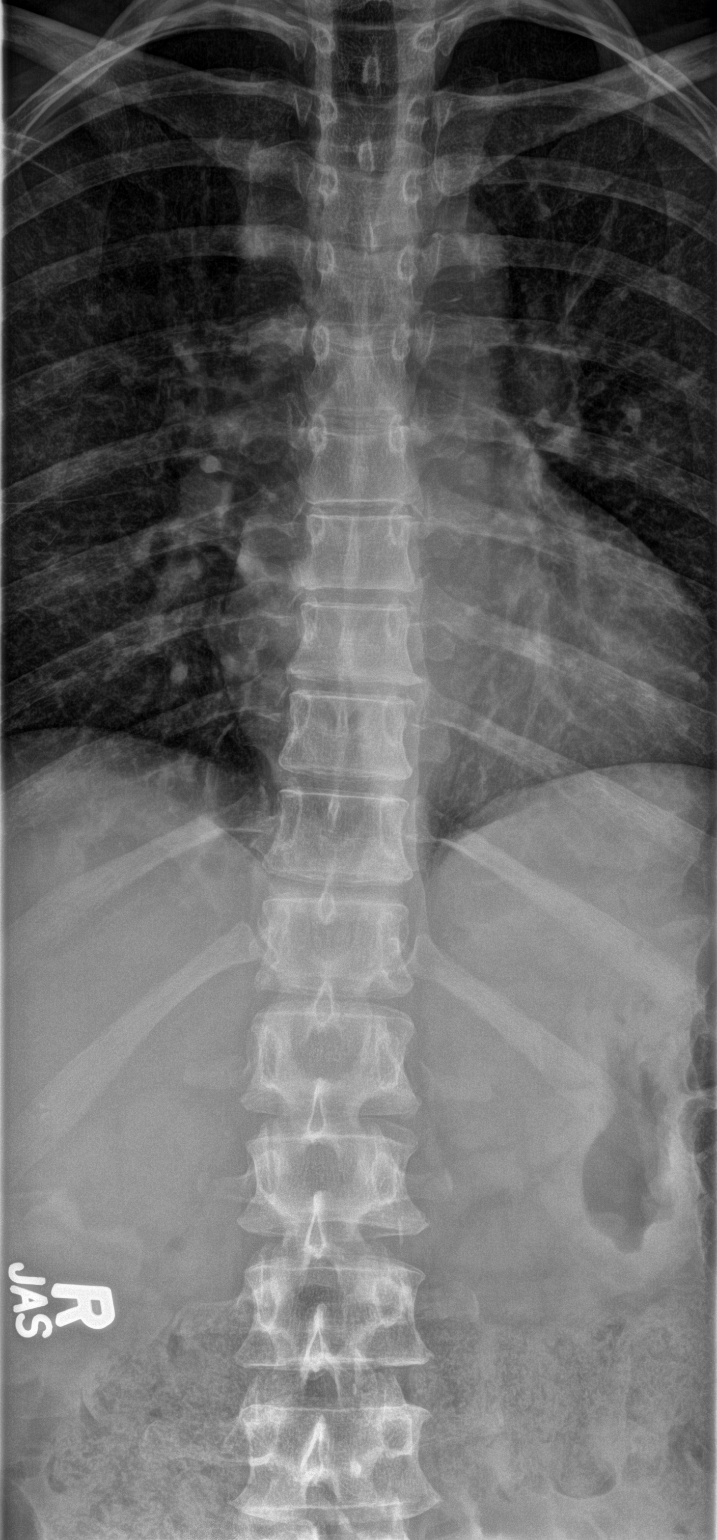

[t-spine lat]
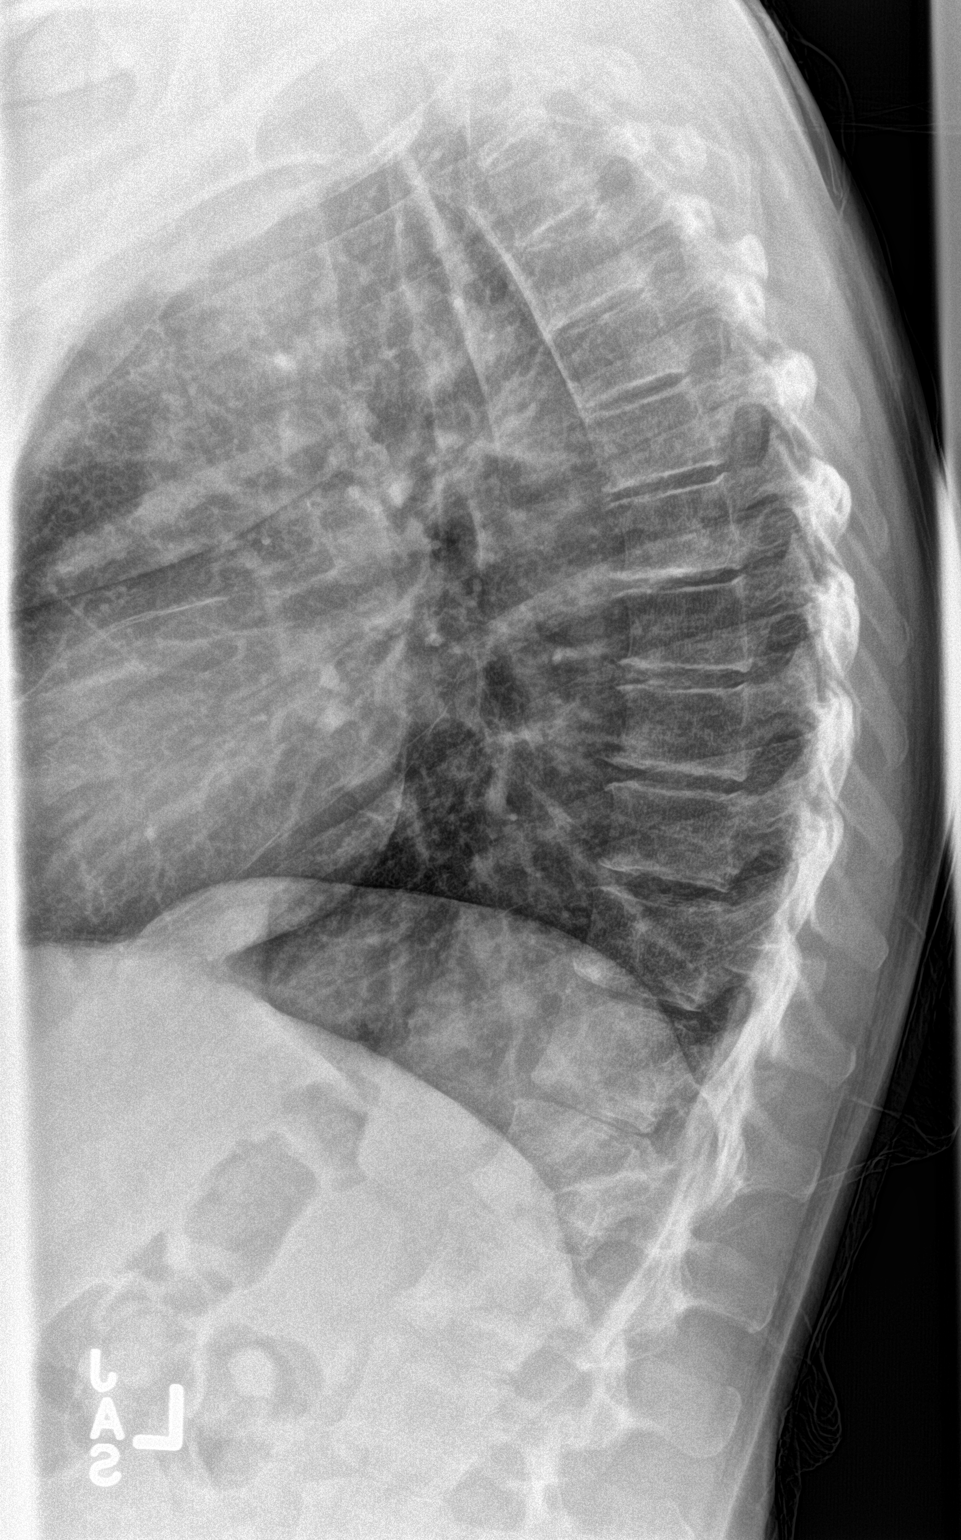

[t-spine swimmers]
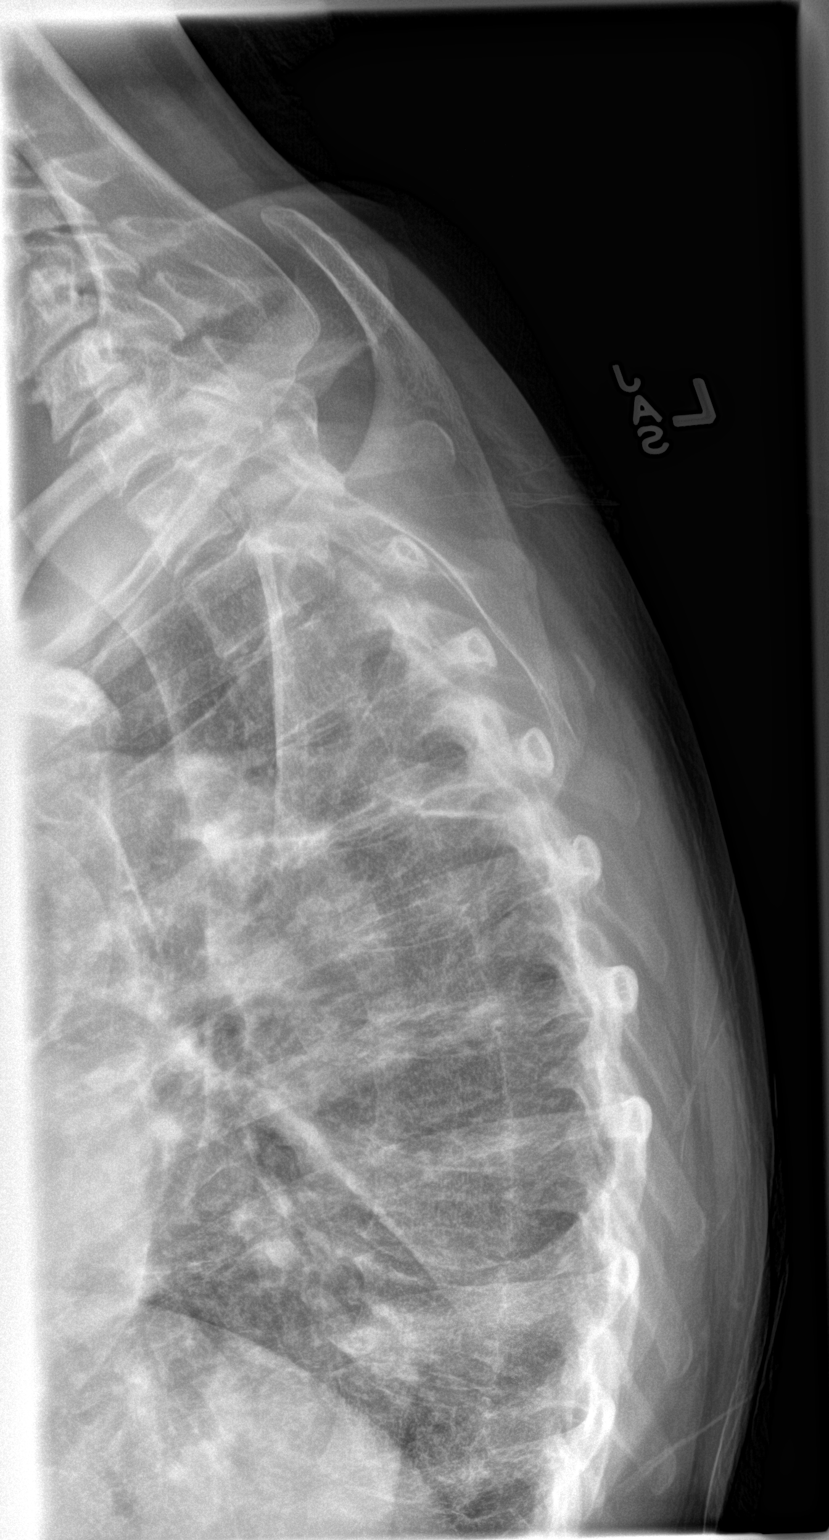

[3 of 3 positions shown; findings below may reference images not displayed]

FINDINGS: There is no evidence of thoracic spine fracture. Alignment is
normal. No other significant bone abnormalities are identified.
IMPRESSION: Negative.

## 2016-02-29 IMAGING — CR DG LUMBAR SPINE COMPLETE 4+V
5 series · 5 of 5 positions shown · non-contrast
Comparison: None.

CLINICAL DATA: Status post fall.  Back pain.

EXAM:
LUMBAR SPINE - COMPLETE 4+ VIEW

[l-spine ap]
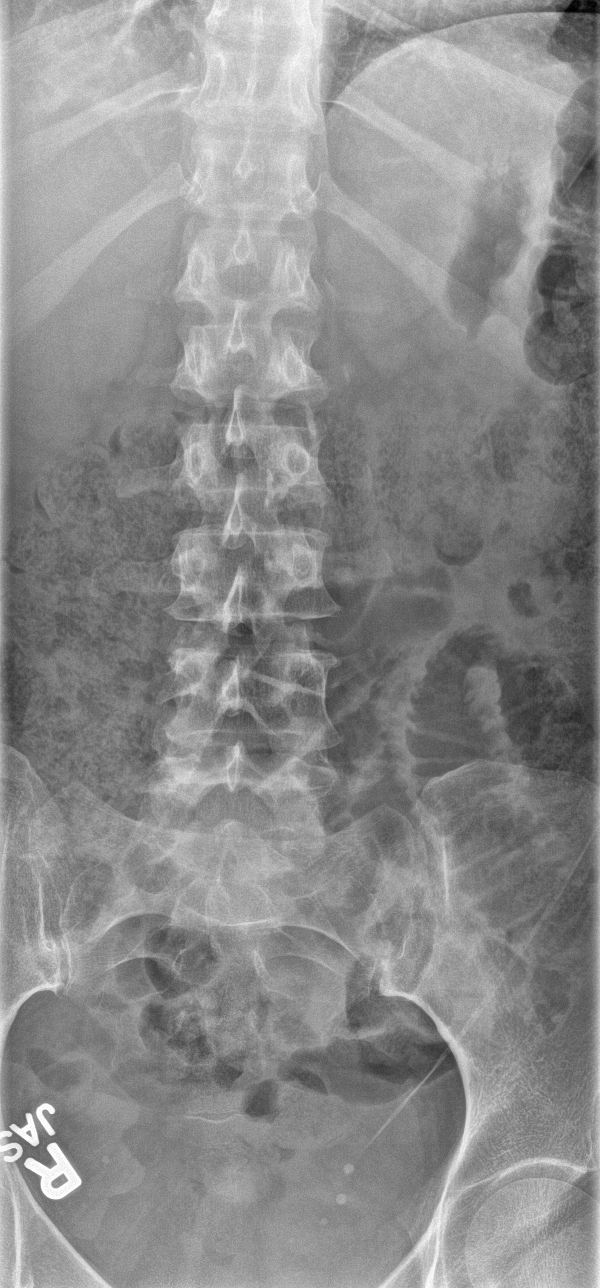

[l-spine obl (1 of 2)]
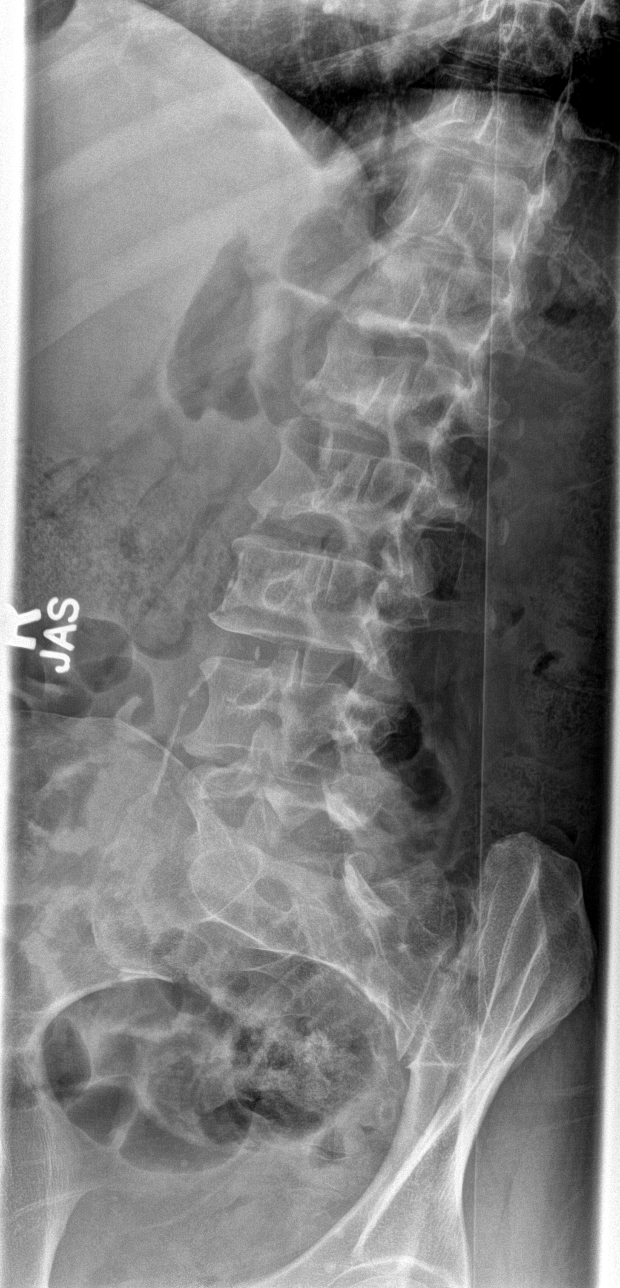

[l-spine obl (2 of 2)]
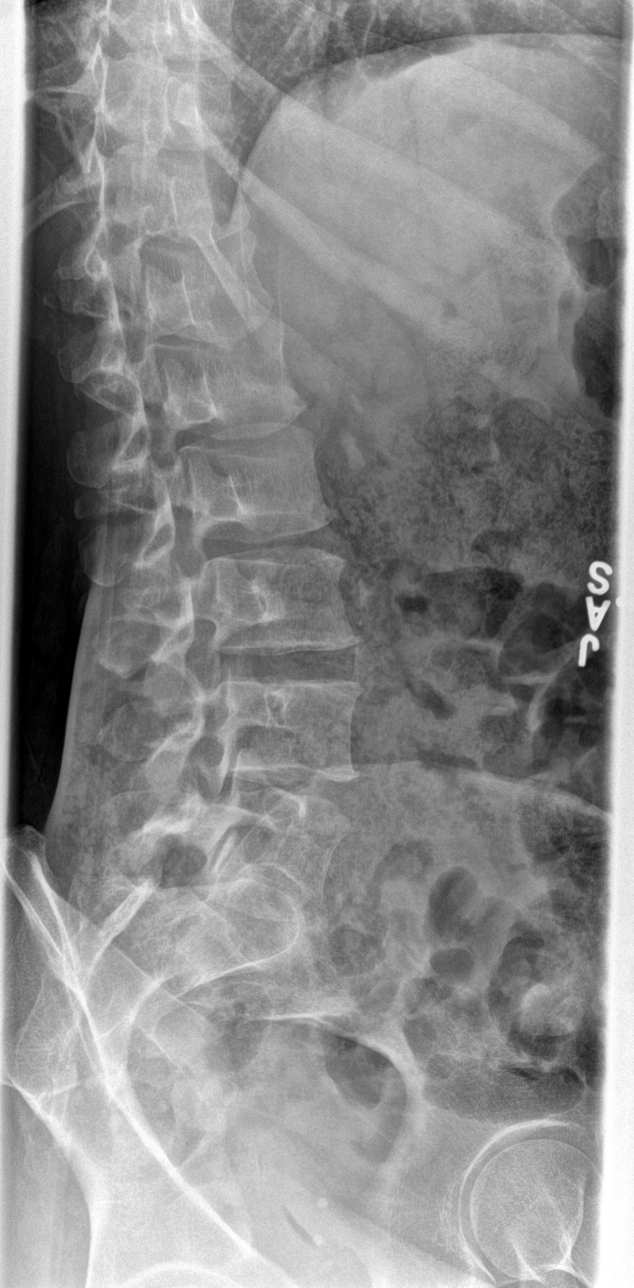

[l-spine lat]
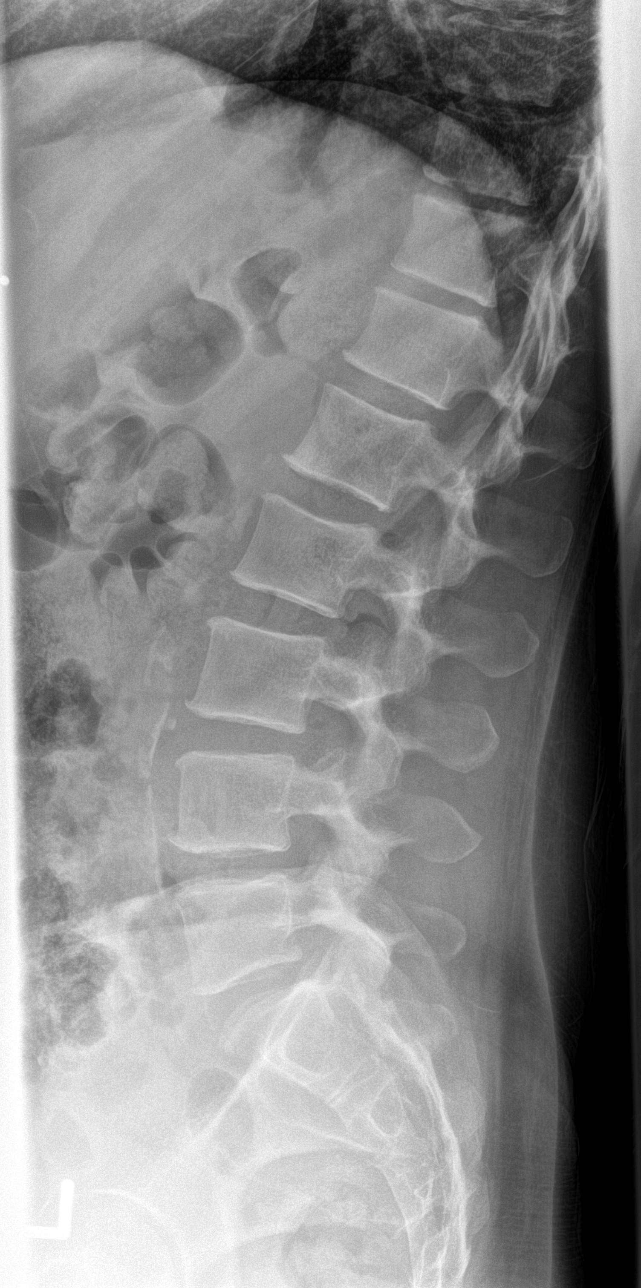

[l-spine spot]
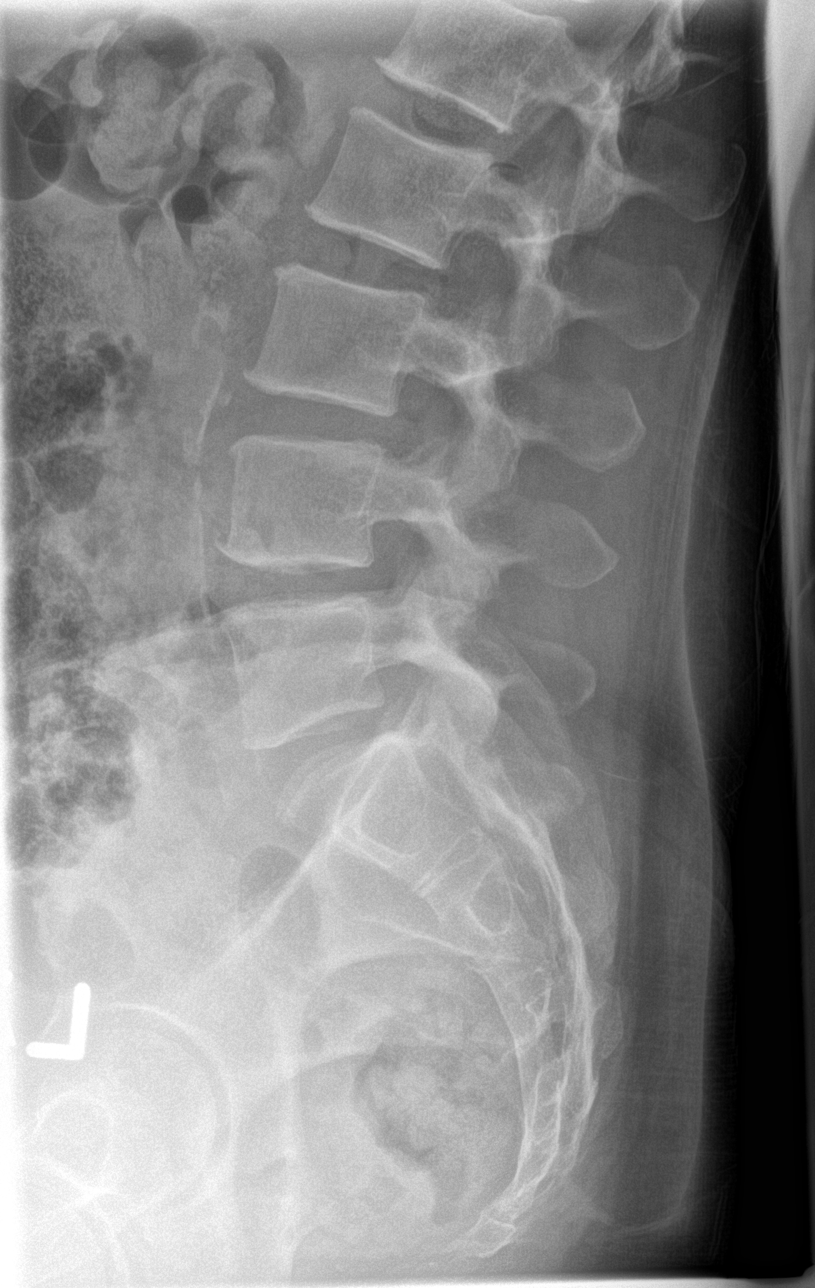

[5 of 5 positions shown; findings below may reference images not displayed]

FINDINGS: There is no evidence of lumbar spine fracture. Alignment is normal.
Intervertebral disc spaces are maintained.

There is abdominal aortic atherosclerosis.
IMPRESSION: No acute osseous injury of the lumbar spine.
